# Patient Record
Sex: Male | Born: 1950 | Race: White | Marital: Married | Smoking: Never smoker
Health system: Southern US, Community
[De-identification: ages and names within clinical notes are randomized; demographics above are authoritative.]

## PROBLEM LIST (undated history)

## (undated) DIAGNOSIS — E119 Type 2 diabetes mellitus without complications: Secondary | ICD-10-CM

## (undated) DIAGNOSIS — R0602 Shortness of breath: Secondary | ICD-10-CM

## (undated) DIAGNOSIS — R058 Other specified cough: Secondary | ICD-10-CM

## (undated) DIAGNOSIS — R1110 Vomiting, unspecified: Secondary | ICD-10-CM

## (undated) DIAGNOSIS — R9431 Abnormal electrocardiogram [ECG] [EKG]: Secondary | ICD-10-CM

## (undated) DIAGNOSIS — D696 Thrombocytopenia, unspecified: Secondary | ICD-10-CM

## (undated) DIAGNOSIS — E785 Hyperlipidemia, unspecified: Secondary | ICD-10-CM

## (undated) DIAGNOSIS — A059 Bacterial foodborne intoxication, unspecified: Secondary | ICD-10-CM

## (undated) HISTORY — DX: Other specified cough: R05.8

## (undated) HISTORY — DX: Bacterial foodborne intoxication, unspecified: A05.9

## (undated) HISTORY — DX: Shortness of breath: R06.02

## (undated) HISTORY — DX: Type 2 diabetes mellitus without complications: E11.9

## (undated) HISTORY — DX: Hyperlipidemia, unspecified: E78.5

## (undated) HISTORY — PX: NO PAST SURGERIES: SHX2092

## (undated) HISTORY — DX: Thrombocytopenia, unspecified: D69.6

## (undated) HISTORY — DX: Vomiting, unspecified: R11.10

## (undated) HISTORY — DX: Abnormal electrocardiogram (ECG) (EKG): R94.31

---

## 2019-04-04 ENCOUNTER — Ambulatory Visit: Payer: Medicare Other | Attending: Internal Medicine

## 2019-04-04 DIAGNOSIS — Z23 Encounter for immunization: Secondary | ICD-10-CM

## 2019-04-04 NOTE — Progress Notes (Signed)
   Covid-19 Vaccination Clinic  Name:  Aarav Burgett    MRN: [id number] DOB: 05-25-1950  04/04/2019  Mr. Hanner was observed post Covid-19 immunization for 15 minutes without incidence. He was provided with Vaccine Information Sheet and instruction to access the V-Safe system.   Mr. Koenigs was instructed to call 911 with any severe reactions post vaccine: Marland Kitchen Difficulty breathing  . Swelling of your face and throat  . A fast heartbeat  . A bad rash all over your body  . Dizziness and weakness    Immunizations Administered    Name Date Dose VIS Date Route   Pfizer COVID-19 Vaccine 04/04/2019  9:14 AM 0.3 mL 01/17/2019 Intramuscular   Manufacturer: [employer], [employer]   Lot: [id number]   NDC: [address]-6

## 2019-04-29 ENCOUNTER — Ambulatory Visit: Payer: Medicare Other | Attending: Internal Medicine

## 2019-04-29 DIAGNOSIS — Z23 Encounter for immunization: Secondary | ICD-10-CM

## 2019-04-29 NOTE — Progress Notes (Signed)
   Covid-19 Vaccination Clinic  Name:  Danzel Marszalek    MRN: [id number] DOB: December 04, 1950  04/29/2019  Mr. Heitman was observed post Covid-19 immunization for 15 minutes without incident. He was provided with Vaccine Information Sheet and instruction to access the V-Safe system.   Mr. Bedel was instructed to call 911 with any severe reactions post vaccine: Marland Kitchen Difficulty breathing  . Swelling of face and throat  . A fast heartbeat  . A bad rash all over body  . Dizziness and weakness   Immunizations Administered    Name Date Dose VIS Date Route   Pfizer COVID-19 Vaccine 04/29/2019  9:57 AM 0.3 mL 01/17/2019 Intramuscular   Manufacturer: [employer], [employer]   Lot: [id number]   NDC: [address]-7

## 2019-10-09 ENCOUNTER — Telehealth: Payer: Self-pay

## 2019-10-09 NOTE — Telephone Encounter (Signed)
EKG ON FILE °

## 2019-10-09 NOTE — Telephone Encounter (Signed)
NOTES ON FILE 

## 2019-10-20 ENCOUNTER — Telehealth: Payer: Self-pay

## 2019-10-20 ENCOUNTER — Ambulatory Visit (INDEPENDENT_AMBULATORY_CARE_PROVIDER_SITE_OTHER): Payer: Medicare Other | Admitting: Internal Medicine

## 2019-10-20 ENCOUNTER — Other Ambulatory Visit: Payer: Self-pay

## 2019-10-20 VITALS — BP 100/70 | HR 74 | Ht 66.0 in | Wt 153.0 lb

## 2019-10-20 DIAGNOSIS — E119 Type 2 diabetes mellitus without complications: Secondary | ICD-10-CM | POA: Diagnosis not present

## 2019-10-20 DIAGNOSIS — E108 Type 1 diabetes mellitus with unspecified complications: Secondary | ICD-10-CM | POA: Insufficient documentation

## 2019-10-20 DIAGNOSIS — E1169 Type 2 diabetes mellitus with other specified complication: Secondary | ICD-10-CM | POA: Insufficient documentation

## 2019-10-20 DIAGNOSIS — E785 Hyperlipidemia, unspecified: Secondary | ICD-10-CM | POA: Diagnosis not present

## 2019-10-20 LAB — PRO B NATRIURETIC PEPTIDE: NT-Pro BNP: 2433 pg/mL — ABNORMAL HIGH (ref 0–376)

## 2019-10-20 NOTE — Telephone Encounter (Signed)
Spoke with the pts son and he reports the pt is not having any discomfort or peripheral edema but he will call prior to his Echo if he develops any symptoms of fluid overload. Otherwise will call after his echo results are available.

## 2019-10-20 NOTE — Progress Notes (Signed)
Cardiology Office Note:    Date:  10/20/2019   ID:  Bryan Melendez, DOB [birth year], MRN [id number]  PCP:  Soundra Pilon, FNP  [facility] Cardiologist:  No primary care provider on file.  [facility] Electrophysiologist:  None   Referring MD: Soundra Pilon, FNP   CC: Check up after trip. Seen for the evaluation of TWI on EKG In consultation at the behest of Bryan Melendez. Patient deferred use of [place] interpretor and opted for his son, who was amenable.  History of Present Illness:    Bryan Melendez is a 69 y.o. male with a hx of HLD on atorvastatin with LDL still 137, Thrombocytopenia NOS, T2DM without long term use of insulin (poorly controlled, A1c 9.7).  Presenting after Nausea and diaphoresis in [place].  Per outsides records [facility]) Patient had been seen in [place] in July 2021; there was concern about his heart and he was started on ASA, Plavix, Lasix, Bisoprolol.   Had chest pain with wretching at that admission.  Was concern he would need heart surgery.    Patient notes that he had food poisoning.  Given the vomiting and diaphoresis, concern for CAD.  Unclear what OSH facility in [place].  Patient notes cough weakness.  Patient end up not taking any of the above medications, only on atorvastatin and metformin.  Can do 9000 steps a day.  No chest pain, chills, night sweats.  Sweats only with exertion (walking up hill). Only one time in [place] did have nausea and vomiting. No PND/ orthopnea, or bendopnea.  Occasional dry cough.  No weight gain.  Past Medical History:  Diagnosis Date  . Abnormal EKG   . DM (diabetes mellitus) (HCC)   . Dry cough   . Food poisoning   . Hyperlipidemia   . SOB (shortness of breath)   . Thrombocytopenia (HCC)   . [place]omiting    [place]    Past Surgical History:  Procedure Laterality Date  . NO PAST SURGERIES      Current Medications: Current Meds  Medication Sig  . atorvastatin (LIPITOR) 40 MG tablet Take 40 mg by  mouth daily.  Marland Kitchen glipiZIDE-metformin (METAGLIP) 5-500 MG tablet Take 1 tablet by mouth 2 (two) times daily before a meal.     Allergies:   Patient has no known allergies.   Social History   Socioeconomic History  . Marital status: Married    Spouse name: Not on file  . Number of children: Not on file  . Years of education: Not on file  . Highest education level: Not on file  Occupational History  . Not on file  Tobacco Use  . Smoking status: Never Smoker  . Smokeless tobacco: Never Used  Substance and Sexual Activity  . Alcohol use: Not on file  . Drug use: Not on file  . Sexual activity: Not on file  Other Topics Concern  . Not on file  Social History Narrative  . Not on file   Social Determinants of Health   Financial Resource Strain:   . Difficulty of Paying Living Expenses: Not on file  Food Insecurity:   . Worried About Programme researcher, broadcasting/film/video in the Last Year: Not on file  . Ran Out of Food in the Last Year: Not on file  Transportation Needs:   . Lack of Transportation (Medical): Not on file  . Lack of Transportation (Non-Medical): Not on file  Physical Activity:   . Days of Exercise per Week:  Not on file  . Minutes of Exercise per Session: Not on file  Stress:   . Feeling of Stress : Not on file  Social Connections:   . Frequency of Communication with Friends and Family: Not on file  . Frequency of Social Gatherings with Friends and Family: Not on file  . Attends Religious Services: Not on file  . Active Member of Clubs or Organizations: Not on file  . Attends Banker Meetings: Not on file  . Marital Status: Not on file    From [place], retired, works on Manufacturing engineer.  Family History: The patient's family history includes Cancer - Prostate in his father; Healthy in his daughter, daughter, daughter, son, and son. No one in the family with heart problems.  ROS:   Please see the history of present illness.    Dysphagia, All other systems  reviewed and are negative.  EKGs/Labs/Other Studies Reviewed:    EKg from OSH personally reviewed:  Sinus rhythm ,rate 83, Left axis deviation, low volcation, anterolateral TWI  Outside labs:  10/08/19 A1c 9.7 LDL 137 Glucose 239 Creatine 0.97 Plt 123  Physical Exam:    VS:  BP 100/70   Pulse 74   Ht 5\' 6"  (1.676 m)   Wt 153 lb (69.4 kg)   SpO2 98%   BMI 24.69 kg/m     Wt Readings from Last 3 Encounters:  10/20/19 153 lb (69.4 kg)     GEN: Well nourished, well developed in no acute distress HEENT: R ear lob Frank Sign NECK: No JVD; No carotid bruits LYMPHATICS: No lymphadenopathy CARDIAC: RRR, no murmurs, rubs, gallops RESPIRATORY:  Clear to auscultation without rales, wheezing or rhonchi  ABDOMEN: Soft, non-tender, non-distended MUSCULOSKELETAL:  No edema; No deformity  SKIN: Warm and dry NEUROLOGIC:  Alert and oriented x 3 PSYCHIATRIC:  Normal affect   ASSESSMENT:    1. Hyperlipidemia, unspecified hyperlipidemia type   2. Type 2 diabetes mellitus without complication, without long-term current use of insulin (HCC)    PLAN:    In order of problems listed above:  1. Diabetes Mellitus 2. History of nausea, vomiting; presences of cough 3. SHARED DECISION MAKING: Patient is asymptomatic, but has DM.  Will check echo and BNP.  If no other sx, will decide further testing based on these results  6 month follow up  Medication Adjustments/Labs and Tests Ordered: Current medicines are reviewed at length with the patient today.  Concerns regarding medicines are outlined above.  No orders of the defined types were placed in this encounter.  No orders of the defined types were placed in this encounter.   There are no Patient Instructions on file for this visit.   Signed, 10/22/19, MD  10/20/2019 8:56 AM    [facility]

## 2019-10-20 NOTE — Telephone Encounter (Signed)
-----   Message from Christell Constant, MD sent at 10/20/2019  4:49 PM EDT ----- Results: Elevated, suggestive of increased ventricular stretch and elevated fluid Plan: Calling patient; low threshold to start lasix now.  Gave education on sx of volume overload; will otherwise make changes after 10/31/19 Echo.  Christell Constant, MD

## 2019-10-20 NOTE — Patient Instructions (Signed)
Medication Instructions: *If you need a refill on your cardiac medications before your next appointment, please call your pharmacy*   Lab Work: Pro BNP  If you have labs (blood work) drawn today and your tests are completely normal, you will receive your results only by: Marland Kitchen MyChart Message (if you have MyChart) OR . A paper copy in the mail If you have any lab test that is abnormal or we need to change your treatment, we will call you to review the results.   Testing/Procedures: Your physician has requested that you have an echocardiogram. Echocardiography is a painless test that uses sound waves to create images of your heart. It provides your doctor with information about the size and shape of your heart and how well your heart's chambers and valves are working. This procedure takes approximately one hour. There are no restrictions for this procedure.    Follow-Up: At [facility], you and your health needs are our priority.  As part of our continuing mission to provide you with exceptional heart care, we have created designated Provider Care Teams.  These Care Teams include your primary Cardiologist (physician) and Advanced Practice Providers (APPs -  Physician Assistants and Nurse Practitioners) who all work together to provide you with the care you need, when you need it.  We recommend signing up for the patient portal called "MyChart".  Sign up information is provided on this After Visit Summary.  MyChart is used to connect with patients for Virtual Visits (Telemedicine).  Patients are able to view lab/test results, encounter notes, upcoming appointments, etc.  Non-urgent messages can be sent to your provider as well.   To learn more about what you can do with MyChart, go to [email or web address].    Your next appointment:   6 month(s)  The format for your next appointment:   In Person  Provider:   Izora Ribas   Other Instructions

## 2019-10-31 ENCOUNTER — Telehealth: Payer: Self-pay | Admitting: Internal Medicine

## 2019-10-31 ENCOUNTER — Other Ambulatory Visit (HOSPITAL_COMMUNITY): Payer: Medicare Other

## 2019-10-31 ENCOUNTER — Encounter (HOSPITAL_COMMUNITY): Payer: Self-pay
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
## 2019-10-31 NOTE — Progress Notes (Signed)
Verified appointment "no show" status with S. Johnson at 08:29. 
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
## 2019-11-12 ENCOUNTER — Other Ambulatory Visit: Payer: Self-pay

## 2019-11-12 ENCOUNTER — Ambulatory Visit (HOSPITAL_COMMUNITY): Payer: Medicare Other | Attending: Internal Medicine

## 2019-11-12 DIAGNOSIS — R0600 Dyspnea, unspecified: Secondary | ICD-10-CM

## 2019-11-12 DIAGNOSIS — E119 Type 2 diabetes mellitus without complications: Secondary | ICD-10-CM | POA: Insufficient documentation

## 2019-11-12 DIAGNOSIS — E785 Hyperlipidemia, unspecified: Secondary | ICD-10-CM | POA: Insufficient documentation

## 2019-11-12 DIAGNOSIS — E108 Type 1 diabetes mellitus with unspecified complications: Secondary | ICD-10-CM | POA: Insufficient documentation

## 2019-11-12 LAB — ECHOCARDIOGRAM COMPLETE
Area-P 1/2: 4.8 cm2
Calc EF: 44.5 %
S' Lateral: 3.8 cm
Single Plane A2C EF: 41.9 %
Single Plane A4C EF: 45 %

## 2019-11-12 MED ORDER — PERFLUTREN LIPID MICROSPHERE
1.0000 mL | INTRAVENOUS | Status: AC | PRN
  Administered 2019-11-12: 1 mL via INTRAVENOUS

## 2019-11-13 ENCOUNTER — Telehealth: Payer: Self-pay

## 2019-11-13 ENCOUNTER — Telehealth: Payer: Self-pay | Admitting: Internal Medicine

## 2019-11-13 NOTE — Telephone Encounter (Signed)
Call placed to Pt with assistance of [place].  Call was answered by Pt's son.  Advised that Pt needed to be seen tomorrow 11/14/2019 to discuss results of echo.  Pt's son will come so no interpreter needed.

## 2019-11-13 NOTE — Telephone Encounter (Signed)
Attempted to call son X1 about his father.  Would like to bring back for eval and for patient and/or son to call back.  Needs schedule for follow up, GDMT converstation, and viability.

## 2019-11-13 NOTE — Telephone Encounter (Signed)
Patient's son returned call.  I did schedule patient for 10/20 with the doctor.  Son would like a call back.

## 2019-11-13 NOTE — Telephone Encounter (Signed)
Called patient.  Discussed Echo Findings.  Discussed limitations in therapy.  Discussed prognosis of heart failure.  Discussed GDMT.  Discussed viability study.  Discussed fluid restrictions.  Discussed etiology of heart failure.  Discussed changes of improvement in LVEF.  Noted that son has lots of questions and will do our best to discuss in more detail at follow up visit.  Patient had no further questions.  15 min encounter.

## 2019-11-13 NOTE — Telephone Encounter (Signed)
-----   Message from Christell Constant, MD sent at 11/12/2019  8:02 PM EDT ----- Results: LAD infarct with HFrEF Will bring back in next open spot (will either need [place] interpretor or son if patient defers)  Christell Constant, MD

## 2019-11-14 ENCOUNTER — Other Ambulatory Visit: Payer: Self-pay

## 2019-11-14 ENCOUNTER — Ambulatory Visit (INDEPENDENT_AMBULATORY_CARE_PROVIDER_SITE_OTHER): Payer: Medicare Other | Admitting: Internal Medicine

## 2019-11-14 ENCOUNTER — Encounter: Payer: Self-pay | Admitting: Internal Medicine

## 2019-11-14 ENCOUNTER — Telehealth: Payer: Self-pay | Admitting: Internal Medicine

## 2019-11-14 VITALS — BP 110/68 | HR 70 | Ht 66.0 in | Wt 157.0 lb

## 2019-11-14 DIAGNOSIS — E785 Hyperlipidemia, unspecified: Secondary | ICD-10-CM

## 2019-11-14 DIAGNOSIS — I5020 Unspecified systolic (congestive) heart failure: Secondary | ICD-10-CM | POA: Diagnosis not present

## 2019-11-14 DIAGNOSIS — E1169 Type 2 diabetes mellitus with other specified complication: Secondary | ICD-10-CM | POA: Diagnosis not present

## 2019-11-14 MED ORDER — ASPIRIN 81 MG PO TBEC: 81.0000 mg | DELAYED_RELEASE_TABLET | Freq: Every day | ORAL | 12 refills | Status: DC

## 2019-11-14 MED ORDER — LOSARTAN POTASSIUM 25 MG PO TABS: 25.0000 mg | ORAL_TABLET | Freq: Every day | ORAL | 3 refills | Status: DC

## 2019-11-14 MED ORDER — FUROSEMIDE 20 MG PO TABS: 20.0000 mg | ORAL_TABLET | Freq: Every day | ORAL | 3 refills | Status: DC

## 2019-11-14 NOTE — Telephone Encounter (Signed)
Spoke with patient's son Marlynn Perking) regarding patient's preferred weekdays and time for scheduling the Cardiac MRI that has been ordered.  Informed him as soon as we hear from the insurance company regarding the prior authorization---I will be in touch with the appointment information

## 2019-11-14 NOTE — Patient Instructions (Addendum)
Medication Instructions:  Your physician has recommended you make the following change in your medication:   1) Start Aspirin 81 mg, 1 tablet by mouth once a day 2) Start Losartan 25 mg, 1 tablet by mouth once a day 3) Start Lasix 20 mg, 1 tablet by mouth once a day  *If you need a refill on your cardiac medications before your next appointment, please call your pharmacy*  Lab Work: Your physician recommends that you return for lab work in 10 days on 11/24/19 **The lab is open from 7:30AM-4:30PM** you may come anytime between these hours  Testing/Procedures: Your physician has requested that you have a cardiac MRI. Cardiac MRI uses a computer to create images of your heart as its beating, producing both still and moving pictures of your heart and major blood vessels. For further information please visit [email or web address]. Please follow the instruction sheet given to you today for more information.  Follow-Up: At [facility], you and your health needs are our priority.  As part of our continuing mission to provide you with exceptional heart care, we have created designated Provider Care Teams.  These Care Teams include your primary Cardiologist (physician) and Advanced Practice Providers (APPs -  Physician Assistants and Nurse Practitioners) who all work together to provide you with the care you need, when you need it.  Your next appointment:   8 week(s)  The format for your next appointment:   In Person  Provider:   Riley Lam, MD

## 2019-11-14 NOTE — Progress Notes (Signed)
Cardiology Office Note:    Date:  11/14/2019   ID:  Bryan Melendez, DOB 1950/03/27, MRN [id number]  PCP:  Bryan Pilon, FNP  [facility] Cardiologist:  Christell Constant, MD  [facility] Electrophysiologist:  None   Referring MD: Bryan Pilon, FNP   CC: New HF  History of Present Illness:    Bryan Melendez is a 69 y.o. male with a hx of HLD, Thombocytopenia NOS, T2Dm without insulin use (last a1c).  In interval evaluation, Patient has elevated ProBNP 2433 and an echo showing apical infarct, no LV thrombus, and and new EF 20-25%.  Patient notes dry cough.  Notes 4 lbs weight gain (back to before his trip).  No SOB, DOE, chest pain, syncope.  No Near syncope.  Still helps out at work.  No abdominal pain, diaphoresis. No palpitations.  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    Past Medical History:  Diagnosis Date  . Abnormal EKG   . DM (diabetes mellitus) (HCC)   . Dry cough   . Food poisoning   . Hyperlipidemia   . SOB (shortness of breath)   . Thrombocytopenia (HCC)   . [place]omiting    [place]    Past Surgical History:  Procedure Laterality Date  . NO PAST SURGERIES      Current Medications: Current Meds  Medication Sig  . atorvastatin (LIPITOR) 40 MG tablet Take 40 mg by mouth daily.  Marland Kitchen glipiZIDE-metformin (METAGLIP) 5-500 MG tablet Take 1 tablet by mouth 2 (two) times daily before a meal.    Allergies:   Patient has no known allergies.   Social History   Socioeconomic History  . Marital status: Married    Spouse name: Not on file  . Number of children: Not on file  . Years of education: Not on file  . Highest education level: Not on file  Occupational History  . Not on file  Tobacco Use  . Smoking status: Never Smoker  . Smokeless tobacco: Never Used  Substance and Sexual Activity  . Alcohol use: Not on file  . Drug use: Not on file  . Sexual activity: Not on file  Other Topics Concern  . Not on file  Social History  Narrative  . Not on file   Social Determinants of Health   Financial Resource Strain:   . Difficulty of Paying Living Expenses: Not on file  Food Insecurity:   . Worried About Programme researcher, broadcasting/film/video in the Last Year: Not on file  . Ran Out of Food in the Last Year: Not on file  Transportation Needs:   . Lack of Transportation (Medical): Not on file  . Lack of Transportation (Non-Medical): Not on file  Physical Activity:   . Days of Exercise per Week: Not on file  . Minutes of Exercise per Session: Not on file  Stress:   . Feeling of Stress : Not on file  Social Connections:   . Frequency of Communication with Friends and Family: Not on file  . Frequency of Social Gatherings with Friends and Family: Not on file  . Attends Religious Services: Not on file  . Active Member of Clubs or Organizations: Not on file  . Attends Banker Meetings: Not on file  . Marital Status: Not on file     Family History: The patient's family history includes Cancer - Prostate in his father; Healthy in his daughter, daughter, daughter, son, and son.  ROS:  Please see the history of present illness.    All other systems reviewed and are negative.  EKGs/Labs/Other Studies Reviewed:    The following studies were reviewed today: 10/08/19 OSH Data from prior visit: EKG:  Sinus rhythm ,rate 83, Left axis deviation, low volcation, anterolateral TWI Outside labs:  10/08/19 A1c 9.7 LDL 137 Glucose 239 Creatine 0.97 Plt 123  EKG today Sinus rhythm, rate 70, anterior Q waves with deep anterior and anterolateral TWI.  EKG:  Recent Labs: 10/20/2019: NT-Pro BNP 2,433   IMPRESSIONS  EF 20-25%- concerned for LAD Infarct. 1. There is no apical LV thrombus on contrast imaging. LVEF is severely  reduced 20-25%. The mid septum, all apical segments, and apex are  akinetic. The myocardium is thinned. Overall, this is conerning for a  large wrap around LAD infarction. Left  ventricular ejection  fraction, by estimation, is 20 to 25%. The left  ventricle has severely decreased function. The left ventricle demonstrates  regional wall motion abnormalities (see scoring diagram/findings for  description). Left ventricular diastolic  parameters are consistent with Grade III diastolic dysfunction  (restrictive). Elevated left atrial pressure.  2. Right ventricular systolic function is normal. The right ventricular  size is normal. There is mildly elevated pulmonary artery systolic  pressure. The estimated right ventricular systolic pressure is 43.3 mmHg.  3. Left atrial size was moderately dilated.  4. The mitral valve is degenerative. Trivial mitral valve regurgitation.  No evidence of mitral stenosis.  5. The aortic valve is tricuspid. Aortic valve regurgitation is trivial.  No aortic stenosis is present.  6. The inferior vena cava is dilated in size with >50% respiratory  variability, suggesting right atrial pressure of 8 mmHg.   Conclusion(s)/Recommendation(s): Findings consistent with ischemic  cardiomyopathy.   Physical Exam:    VS:  BP 110/68   Pulse 70   Ht 5\' 6"  (1.676 m)   Wt 157 lb (71.2 kg)   SpO2 97%   BMI 25.34 kg/m     Wt Readings from Last 3 Encounters:  11/14/19 157 lb (71.2 kg)  10/20/19 153 lb (69.4 kg)    GEN: Well nourished, well developed in no acute distress HEENT: Normal NECK: No JVD; No carotid bruits LYMPHATICS: No lymphadenopathy CARDIAC: RRR, no murmurs, rubs, gallops RESPIRATORY:  Clear to auscultation without rales, wheezing or rhonchi  ABDOMEN: Soft, non-tender, non-distended MUSCULOSKELETAL:  No edema; No deformity  SKIN: Warm and dry NEUROLOGIC:  Alert and oriented x 3 PSYCHIATRIC:  Normal affect   ASSESSMENT:    1. HFrEF (heart failure with reduced ejection fraction) (HCC)   2. Hyperlipidemia, unspecified hyperlipidemia type   3. Type 2 diabetes mellitus with hyperlipidemia (HCC)    PLAN:    In order of problems listed  above:  New Systolic Heart Failure Concern for CAD HLD, DM  - NYHA class I, Stage B, hypervolemic, etiology from coronaries needs to be excluded - Will start lasix 20 mg po Daily - ASA 81 mg start - Discussed daily weights, and fluid restriction of < 2 L  - Discussed physical restrictions. - Check BMP, Mg 7-10 days - Will defer metoprolol succinate 25 mg PO daily till next visit - will start losartan 25 mg and will eventually push to entresto - will attempt MRA in subsequent visits - Will obtain ambulatory BP cuff monitoring - will get CMR Viability study - Will discussed with PCP 10/22/19 about outpatient SGLT2i  - [name] Interpretor number [phone] - will offer future cardiac rehab  6-8 weeks follow up for medication titraton  Medication Adjustments/Labs and Tests Ordered: Current medicines are reviewed at length with the patient today.  Concerns regarding medicines are outlined above.  No orders of the defined types were placed in this encounter.  No orders of the defined types were placed in this encounter.   There are no Patient Instructions on file for this visit.   Signed, Christell Constant, MD  11/14/2019 11:03 AM    [facility]

## 2019-11-18 ENCOUNTER — Encounter: Payer: Self-pay | Admitting: Internal Medicine

## 2019-11-18 NOTE — Telephone Encounter (Signed)
Spoke with son Sajmir regarding appointment for Cardiac MRI scheduled Wednesday 12/10/19 at 9:00 am at [facility]-----arrival time is 8:30 am 1st floor admissions office----will mail informationt to patient ---Sajmir voiced his understanding.

## 2019-11-24 ENCOUNTER — Other Ambulatory Visit: Payer: Self-pay

## 2019-11-24 ENCOUNTER — Other Ambulatory Visit: Payer: Medicare Other | Admitting: *Deleted

## 2019-11-24 DIAGNOSIS — E785 Hyperlipidemia, unspecified: Secondary | ICD-10-CM

## 2019-11-24 DIAGNOSIS — E1169 Type 2 diabetes mellitus with other specified complication: Secondary | ICD-10-CM

## 2019-11-24 DIAGNOSIS — I5020 Unspecified systolic (congestive) heart failure: Secondary | ICD-10-CM

## 2019-11-25 LAB — PRO B NATRIURETIC PEPTIDE: NT-Pro BNP: 837 pg/mL — ABNORMAL HIGH (ref 0–376)

## 2019-11-25 LAB — MAGNESIUM: Magnesium: 1.9 mg/dL (ref 1.6–2.3)

## 2019-11-26 ENCOUNTER — Telehealth: Payer: Self-pay | Admitting: Internal Medicine

## 2019-11-26 ENCOUNTER — Ambulatory Visit: Payer: Medicare Other | Admitting: Internal Medicine

## 2019-11-26 NOTE — Telephone Encounter (Signed)
Pt's son aware of lab results  B/P readings :  10-11  B/P 113/77 HR 66                     112/76      70                               111/66      77                      112/69      67                      110/70     67                     102/64      72                     115/72     67 Per pt feels fine Will forward to Dr Izora Ribas for review and recommendations./cy

## 2019-11-26 NOTE — Telephone Encounter (Signed)
Follow Up:     Pt is returning call from today, concerning his lab results.

## 2019-11-26 NOTE — Telephone Encounter (Signed)
Attempted call X1:  Labs have improved.  Left Voicemail calling with good news.  Would attempted to get ambulatory blood pressure and heart rate.  Goal to add metoprolol succinate 25 mg q one daily based on ambulatory data.  Team: Please reach out to patient later in day with good news about lab and asking for this data.  Thank you!  Christell Constant, MD

## 2019-11-26 NOTE — Telephone Encounter (Signed)
Attempted call X1:  Labs have improved.  Left Voicemail calling with good news.  Would attempted to get ambulatory blood pressure and heart rate.  Goal to add metoprolol succinate 25 mg q one daily based on ambulatory data.  Team: Please reach out to patient later in day with good news about lab and asking for this data.  Thank you!  Quayshawn Nin A Elai Vanwyk, MD  

## 2019-11-27 ENCOUNTER — Other Ambulatory Visit: Payer: Self-pay | Admitting: *Deleted

## 2019-11-27 MED ORDER — METOPROLOL SUCCINATE 25 MG PO CS24: 25.0000 mg | EXTENDED_RELEASE_CAPSULE | Freq: Every day | ORAL | 6 refills | Status: DC

## 2019-11-27 MED ORDER — METOPROLOL SUCCINATE 25 MG PO CS24: 25.0000 mg | EXTENDED_RELEASE_CAPSULE | Freq: Every day | ORAL | 11 refills | Status: DC

## 2019-11-27 MED ORDER — METOPROLOL SUCCINATE ER 25 MG PO TB24: 25.0000 mg | ORAL_TABLET | Freq: Every day | ORAL | 11 refills | Status: DC

## 2019-11-27 NOTE — Addendum Note (Signed)
Addended by: Scherrie Bateman E on: 11/27/2019 12:31 PM   Modules accepted: Orders

## 2019-11-27 NOTE — Telephone Encounter (Signed)
Pt's son aware of recommendations agrees and verbalizes understanding ./cy

## 2019-11-27 NOTE — Telephone Encounter (Signed)
Let's try metoprolol succinate 25 mg daily.  We would ask that the patient check his blood pressure and heart rate.  If he starts feeling more tired and fatigued on this medication, we will stop it.  This is one of those live longer, or mortality medications, so it is worth an attempt to see if it could help strengthen his heart.  Thanks,  Christell Constant, MD

## 2019-12-09 ENCOUNTER — Telehealth (HOSPITAL_COMMUNITY): Payer: Self-pay | Admitting: Emergency Medicine

## 2019-12-09 NOTE — Telephone Encounter (Signed)
Reaching out to patient to offer assistance regarding upcoming cardiac imaging study; pt verbalizes understanding of appt date/time, parking situation and where to check in, pre-test NPO status and medications ordered, and verified current allergies; name and call back number provided for further questions should they arise Rockwell Alexandria RN Navigator Cardiac Imaging [facility] [phone] office [phone] cell  Pt speaks [place] Spoke to patients son who speaks english and available to help translate for the patient the day of the test.  Denies implants, denies claustro.

## 2019-12-10 ENCOUNTER — Telehealth: Payer: Self-pay | Admitting: *Deleted

## 2019-12-10 ENCOUNTER — Other Ambulatory Visit: Payer: Self-pay

## 2019-12-10 ENCOUNTER — Ambulatory Visit (HOSPITAL_COMMUNITY)
Admission: RE | Admit: 2019-12-10 | Discharge: 2019-12-10 | Disposition: A | Discharge status: Home / Self Care | Payer: Medicare Other | Source: Ambulatory Visit | Attending: Internal Medicine | Admitting: Internal Medicine

## 2019-12-10 DIAGNOSIS — I5020 Unspecified systolic (congestive) heart failure: Secondary | ICD-10-CM

## 2019-12-10 MED ORDER — GADOBUTROL 1 MMOL/ML IV SOLN
8.0000 mL | Freq: Once | INTRAVENOUS | Status: AC | PRN
  Administered 2019-12-10: 8 mL via INTRAVENOUS

## 2019-12-10 NOTE — Telephone Encounter (Signed)
I spoke with patient's son and reviewed cardiac MRI results with him.  Appointment with Dr Izora Ribas moved to earlier date--November 78,4784 at 8:20. Son reports the following readings for patient since starting Toprol. 10/21-125/74,69 10/22-111/69,66 10/23-100/64,62 10/24-119/59,64 10/25-109/70,65 10/26-108/62,59 10/27-110/70,62 10/28-125/70,66 10/29-108/68,66  I told him readings looked OK and we would call him back if Dr Izora Ribas wanted to make any changes.

## 2019-12-25 ENCOUNTER — Ambulatory Visit (INDEPENDENT_AMBULATORY_CARE_PROVIDER_SITE_OTHER): Payer: Medicare Other | Admitting: Internal Medicine

## 2019-12-25 ENCOUNTER — Encounter: Payer: Self-pay | Admitting: Internal Medicine

## 2019-12-25 ENCOUNTER — Other Ambulatory Visit: Payer: Self-pay

## 2019-12-25 VITALS — BP 108/62 | HR 59 | Ht 66.0 in | Wt 159.0 lb

## 2019-12-25 DIAGNOSIS — E785 Hyperlipidemia, unspecified: Secondary | ICD-10-CM

## 2019-12-25 DIAGNOSIS — I5020 Unspecified systolic (congestive) heart failure: Secondary | ICD-10-CM

## 2019-12-25 DIAGNOSIS — R931 Abnormal findings on diagnostic imaging of heart and coronary circulation: Secondary | ICD-10-CM | POA: Diagnosis not present

## 2019-12-25 DIAGNOSIS — E1169 Type 2 diabetes mellitus with other specified complication: Secondary | ICD-10-CM | POA: Diagnosis not present

## 2019-12-25 MED ORDER — ENTRESTO 24-26 MG PO TABS: 1.0000 | ORAL_TABLET | Freq: Two times a day (BID) | ORAL | 12 refills | Status: DC

## 2019-12-25 NOTE — Patient Instructions (Addendum)
Medication Instructions:  Your physician has recommended you make the following change in your medication:   1) Stop Losartan 2) Start Entresto 24-26 mg, 1 tablet by mouth twice a day  *If you need a refill on your cardiac medications before your next appointment, please call your pharmacy*   Lab Work: Your physician recommends that you return for lab work in 7 days for BMET/Magnesium/BNP  Testing/Procedures: Your physician has requested that you have an echocardiogram in 90 days. Echocardiography is a painless test that uses sound waves to create images of your heart. It provides your doctor with information about the size and shape of your heart and how well your heart's chambers and valves are working. This procedure takes approximately one hour. There are no restrictions for this procedure.  Your cardiac CT will be scheduled at one of the below locations:   [facility] [address] [place], [place] [address] [phone]  [place] [address] [place], [place] [address] [phone]  If scheduled at [facility], please arrive at the [facility] main entrance of [facility] 30 minutes prior to test start time. Proceed to the [facility] Radiology Department (first floor) to check-in and test prep.  If scheduled at [facility], please arrive 15 mins early for check-in and test prep.  Please follow these instructions carefully (unless otherwise directed):  On the Night Before the Test: . Be sure to Drink plenty of water. . Do not consume any caffeinated/decaffeinated beverages or chocolate 12 hours prior to your test. . Do not take any antihistamines 12 hours prior to your test.  On the Day of the Test: . Drink plenty of water. Do not drink any water within one hour of the test. . Do not eat any food 4 hours prior to the test. . You may take your regular  medications prior to the test.  . Take metoprolol (Lopressor) two hours prior to test. . HOLD Furosemide morning of the test. . HOLD Glipizide-Metformin the morning of your test and two days after your test.    After the Test: . Drink plenty of water. . After receiving IV contrast, you may experience a mild flushed feeling. This is normal. . On occasion, you may experience a mild rash up to 24 hours after the test. This is not dangerous. If this occurs, you can take Benadryl 25 mg and increase your fluid intake. . If you experience trouble breathing, this can be serious. If it is severe call 911 IMMEDIATELY. If it is mild, please call our office. . If you take any of these medications: Glipizide/Metformin, Avandament, Glucavance, please do not take 48 hours after completing test unless otherwise instructed.  Once we have confirmed authorization from your insurance company, we will call you to set up a date and time for your test. Based on how quickly your insurance processes prior authorizations requests, please allow up to 4 weeks to be contacted for scheduling your Cardiac CT appointment. Be advised that routine Cardiac CT appointments could be scheduled as many as 8 weeks after your provider has ordered it.  For non-scheduling related questions, please contact the cardiac imaging nurse navigator should you have any questions/concerns: Marchia Bond, Cardiac Imaging Nurse Navigator Burley Saver, Interim Cardiac Imaging Nurse [place] and Vascular Services Direct Office Dial: [phone]   For scheduling needs, including cancellations and rescheduling, please call [place], [phone] (temporary number).  Follow-Up: At [facility], you and your health needs are our priority.  As part of our continuing mission to provide you with exceptional heart care, we have created designated Provider Care Teams.  These Care Teams include your primary Cardiologist (physician) and  Advanced Practice Providers (APPs -  Physician Assistants and Nurse Practitioners) who all work together to provide you with the care you need, when you need it.  Your next appointment:   3 months  The format for your next appointment:   In Person  Provider:   Rudean Haskell, MD

## 2019-12-25 NOTE — Progress Notes (Signed)
Cardiology Office Note:    Date:  12/25/2019   ID:  Bryan Melendez, DOB November 08, 1950, MRN [id number]  PCP:  Kristen Loader, FNP  [facility] Cardiologist:  Werner Lean, MD  [place] Electrophysiologist:  None   Referring MD: Kristen Loader, FNP  CC:  Follow up HF and after MRI viability  Patient deferred interpretor in lieu of his son - [place] Interpretor number [phone]  History of Present Illness:    Bryan Melendez is a 69 y.o. male with a hx of HLD, Thombocytopenia NOS, T2DM who had a likely NSTEMI 06/2019 in [place], seen in consultation 10/20/19.  Found to have HFrEF (20-25%) 11/12/19 with LAD WMAs and NT-proBNP of 2433.  On GDMT (lasix 20 mg, losartan 25 mg, metoprolol succinate 25 mg), NT-proBNP improved to 837, MR was ~35% with evidence of apical transmural scar.  Patient notes that he is doing well, and that his strength has gone up.  Stays busy; takes his grand-niece to school and has been working on the house without issues.  No chest pain, no breathing problems.  Weight is up this is his normal weight, but patient denies LE swelling, PND.  No syncope or near syncope.  Past Medical History:  Diagnosis Date  . Abnormal EKG   . DM (diabetes mellitus) ([place])   . Dry cough   . Food poisoning   . Hyperlipidemia   . SOB (shortness of breath)   . Thrombocytopenia ([place])   . Vomiting    [place]    Past Surgical History:  Procedure Laterality Date  . NO PAST SURGERIES      Current Medications: Current Meds  Medication Sig  . aspirin 81 MG EC tablet Take 1 tablet (81 mg total) by mouth daily. Swallow whole.  Marland Kitchen atorvastatin (LIPITOR) 40 MG tablet Take 40 mg by mouth daily.  . furosemide (LASIX) 20 MG tablet Take 1 tablet (20 mg total) by mouth daily.  Marland Kitchen glipiZIDE-metformin (METAGLIP) 5-500 MG tablet Take 1 tablet by mouth 2 (two) times daily before a meal.  . metoprolol succinate (TOPROL XL) 25 MG 24 hr tablet Take 1 tablet (25 mg total) by mouth  daily.  . [DISCONTINUED] losartan (COZAAR) 25 MG tablet Take 1 tablet (25 mg total) by mouth daily.    Allergies:   Patient has no known allergies.   Social History   Socioeconomic History  . Marital status: Married    Spouse name: Not on file  . Number of children: Not on file  . Years of education: Not on file  . Highest education level: Not on file  Occupational History  . Not on file  Tobacco Use  . Smoking status: Never Smoker  . Smokeless tobacco: Never Used  Substance and Sexual Activity  . Alcohol use: Not on file  . Drug use: Not on file  . Sexual activity: Not on file  Other Topics Concern  . Not on file  Social History Narrative  . Not on file   Social Determinants of Health   Financial Resource Strain:   . Difficulty of Paying Living Expenses: Not on file  Food Insecurity:   . Worried About Charity fundraiser in the Last Year: Not on file  . Ran Out of Food in the Last Year: Not on file  Transportation Needs:   . Lack of Transportation (Medical): Not on file  . Lack of Transportation (Non-Medical): Not on file  Physical Activity:   . Days of Exercise  per Week: Not on file  . Minutes of Exercise per Session: Not on file  Stress:   . Feeling of Stress : Not on file  Social Connections:   . Frequency of Communication with Friends and Family: Not on file  . Frequency of Social Gatherings with Friends and Family: Not on file  . Attends Religious Services: Not on file  . Active Member of Clubs or Organizations: Not on file  . Attends Archivist Meetings: Not on file  . Marital Status: Not on file   Family History: The patient's family history includes Cancer - Prostate in his father; Healthy in his daughter, daughter, daughter, son, and son.  ROS:   Please see the history of present illness.    All other systems reviewed and are negative.  EKGs/Labs/Other Studies Reviewed:    The following studies were reviewed today: 10/08/19 OSH Data from  prior visit: EKG:  Sinus rhythm ,rate 83, Left axis deviation, low volcation, anterolateral TWI Outside labs:  10/08/19 A1c 9.7 LDL 137 Glucose 239 Creatine 0.97 Plt 123  EKG today Sinus rhythm, rate 70, anterior Q waves with deep anterior and anterolateral TWI.  EKG:  Recent Labs: 11/24/2019: Magnesium 1.9; NT-Pro BNP 837   IMPRESSIONS  EF 20-25%- concerned for LAD Infarct. 1. There is no apical LV thrombus on contrast imaging. LVEF is severely  reduced 20-25%. The mid septum, all apical segments, and apex are  akinetic. The myocardium is thinned. Overall, this is conerning for a  large wrap around LAD infarction. Left  ventricular ejection fraction, by estimation, is 20 to 25%. The left  ventricle has severely decreased function. The left ventricle demonstrates  regional wall motion abnormalities (see scoring diagram/findings for  description). Left ventricular diastolic  parameters are consistent with Grade III diastolic dysfunction  (restrictive). Elevated left atrial pressure.  2. Right ventricular systolic function is normal. The right ventricular  size is normal. There is mildly elevated pulmonary artery systolic  pressure. The estimated right ventricular systolic pressure is 34.2 mmHg.  3. Left atrial size was moderately dilated.  4. The mitral valve is degenerative. Trivial mitral valve regurgitation.  No evidence of mitral stenosis.  5. The aortic valve is tricuspid. Aortic valve regurgitation is trivial.  No aortic stenosis is present.  6. The inferior vena cava is dilated in size with >50% respiratory  variability, suggesting right atrial pressure of 8 mmHg.   Conclusion(s)/Recommendation(s): Findings consistent with ischemic  cardiomyopathy.    12/10/19 CMR FINDINGS: 1. Moderate to severely dilated left ventricle, thin myocardium, and systolic function is moderately reduced (LVEF =36%). There is mid anterior, anteroseptal, and inferoseptal hypokinesis, as  well as apical hypokinesis (all territories). No LV thrombus is noted. There is transmural late gadolinium enhancement: Inferoseptal mid and apex, anteroseptal mid and apex, anterior mid and apex, anterolateral apex, and true apex. These segments are likely nonviable.  LVEDD: 63 mm  LVESD: 41 mm  LVEDVi:132 mL/m2  SVi:48 mL/m2  CI: 3.08 L/min/m2  Myocardial mass/BSA:77 g/m2  2. Normal right ventricular size, thickness and systolic function (RVEF =87%). There are no regional wall motion abnormalities.  3.  Normal left and right atrial size.  LVESV 70 mL  RVESV 26 mL  4. Normal size of the aortic root, ascending aorta and pulmonary artery.  5.  No significant valvular abnormalities.  6.  Normal pericardium.  No pericardial effusion.  IMPRESSION: Moderate to severely dilated left ventricle, thin myocardium, and systolic function is moderately reduced (LVEF =36%).  There is mid anterior, anteroseptal, and inferoseptal hypokinesis, as well as apical hypokinesis (all territories). No LV thrombus is noted. There is transmural late gadolinium enhancement: Inferoseptal mid and apex, anteroseptal mid and apex, anterior mid and apex, anterolateral apex, and true apex. These segments are likely nonviable.  Tempie Gibeault  Physical Exam:    VS:  BP 108/62   Pulse (!) 59   Ht 5' 6"  (1.676 m)   Wt 159 lb (72.1 kg)   SpO2 97%   BMI 25.66 kg/m     Wt Readings from Last 3 Encounters:  12/25/19 159 lb (72.1 kg)  11/14/19 157 lb (71.2 kg)  10/20/19 153 lb (69.4 kg)    GEN: Well nourished, well developed in no acute distress HEENT: Normal NECK: No JVD; No carotid bruits LYMPHATICS: No lymphadenopathy CARDIAC: RRR, no murmurs, rubs, gallops RESPIRATORY:  Clear to auscultation without rales, wheezing or rhonchi  ABDOMEN: Soft, non-tender, non-distended MUSCULOSKELETAL:  No edema; No deformity  SKIN: Warm and dry NEUROLOGIC:  Alert and oriented x  3 PSYCHIATRIC:  Normal affect   ASSESSMENT:    1. HFrEF (heart failure with reduced ejection fraction) ([place])   2. Type 2 diabetes mellitus with hyperlipidemia ([place])   3. Hyperlipidemia, unspecified hyperlipidemia type   4. Abnormal findings on diagnostic imaging of heart and coronary circulation     PLAN:    In order of problems listed above:  Heart Failure with reduced ejection Fraction; EF 35% Concern for CAD HLD, DM  - NYHA class I, Stage B, euvolemic, etiology from coronaries needs to be excluded - Will continue lasix 20 mg po Daily - ASA 81 mg  - metoprolol succinate 25 mg PO Daily - will transition losartan 25 mg to ARNI 24-26 mg (gave patient assistance information; if this becomes an issues will return to losartan 50 mg) - Discussed daily weights, and fluid restriction of < 2 L  - Check BMP, Mg, NT- proBNP 7-10 days - will get repeat echo in 90 days and will discuss ICD further based on these results - will attempt MRA in subsequent visits (likely after BMP/Mg post entresto switch barring kidney dysfunction) - will attempt future SGLTi (concern that cost may be prohibitive) - Continue Ambulatory BP cuff monitoring - Would offer cardiac rehab in the future  SHARED DECISION MAKING:  Discussed at length the risk and benefits of LHC to define his anatomy in the setting of non viable territory in the LAD, DM, and Asymptomatic HFrEF.  Discussed Risks and benefits of cardiac catheterization have been discussed with the patient.  These include bleeding, infection, kidney damage, stroke, heart attack, death.  Discussed possible PCI, possible CABG, and how this may or may not increase his EF and improve sx.  Discussed alternatives (GDMT only, CCTA +/- FFR, or LHC).  Discussed at length with patient and son.  Will try CCTA  Briefly broached the conversation of primary prevention ICD  6-8 weeks(after CCTA) follow up unless new symptoms or abnormal test results warranting change in  plan  Medication Adjustments/Labs and Tests Ordered: Current medicines are reviewed at length with the patient today.  Concerns regarding medicines are outlined above.  Orders Placed This Encounter  Procedures  . CT CORONARY MORPH W/CTA COR W/SCORE W/CA W/CM &/OR WO/CM  . CT CORONARY FRACTIONAL FLOW RESERVE DATA PREP  . CT CORONARY FRACTIONAL FLOW RESERVE FLUID ANALYSIS  . Basic metabolic panel  . Magnesium  . Pro b natriuretic peptide (BNP)  . ECHOCARDIOGRAM COMPLETE  Meds ordered this encounter  Medications  . sacubitril-valsartan (ENTRESTO) 24-26 MG    Sig: Take 1 tablet by mouth 2 (two) times daily.    Dispense:  60 tablet    Refill:  12    Patient Instructions  Medication Instructions:  Your physician has recommended you make the following change in your medication:   1) Stop Losartan 2) Start Entresto 24-26 mg, 1 tablet by mouth twice a day  *If you need a refill on your cardiac medications before your next appointment, please call your pharmacy*   Lab Work: Your physician recommends that you return for lab work in 7 days for BMET/Magnesium/BNP  Testing/Procedures: Your physician has requested that you have an echocardiogram in 90 days. Echocardiography is a painless test that uses sound waves to create images of your heart. It provides your doctor with information about the size and shape of your heart and how well your heart's chambers and valves are working. This procedure takes approximately one hour. There are no restrictions for this procedure.  Your cardiac CT will be scheduled at one of the below locations:   [facility] [address] [place], [place] [address] [phone]  [place] [address] [place], [place] [address] [phone]  If scheduled at [facility], please arrive at the [facility] main entrance of [facility] 30 minutes prior to test start  time. Proceed to the [facility] Radiology Department (first floor) to check-in and test prep.  If scheduled at [facility], please arrive 15 mins early for check-in and test prep.  Please follow these instructions carefully (unless otherwise directed):  On the Night Before the Test: . Be sure to Drink plenty of water. . Do not consume any caffeinated/decaffeinated beverages or chocolate 12 hours prior to your test. . Do not take any antihistamines 12 hours prior to your test.  On the Day of the Test: . Drink plenty of water. Do not drink any water within one hour of the test. . Do not eat any food 4 hours prior to the test. . You may take your regular medications prior to the test.  . Take metoprolol (Lopressor) two hours prior to test. . HOLD Furosemide morning of the test. . HOLD Glipizide-Metformin the morning of your test and two days after your test.    After the Test: . Drink plenty of water. . After receiving IV contrast, you may experience a mild flushed feeling. This is normal. . On occasion, you may experience a mild rash up to 24 hours after the test. This is not dangerous. If this occurs, you can take Benadryl 25 mg and increase your fluid intake. . If you experience trouble breathing, this can be serious. If it is severe call 911 IMMEDIATELY. If it is mild, please call our office. . If you take any of these medications: Glipizide/Metformin, Avandament, Glucavance, please do not take 48 hours after completing test unless otherwise instructed.  Once we have confirmed authorization from your insurance company, we will call you to set up a date and time for your test. Based on how quickly your insurance processes prior authorizations requests, please allow up to 4 weeks to be contacted for scheduling your Cardiac CT appointment. Be advised that routine Cardiac CT appointments could be scheduled as many as 8 weeks after your provider has ordered it.  For  non-scheduling related questions, please contact the cardiac imaging nurse  navigator should you have any questions/concerns: Marchia Bond, Cardiac Imaging Nurse Navigator Burley Saver, Interim Cardiac Imaging Nurse Navigator [place] [facility] Direct Office Dial: [phone]   For scheduling needs, including cancellations and rescheduling, please call [place], [phone] (temporary number).   Follow-Up: At [facility], you and your health needs are our priority.  As part of our continuing mission to provide you with exceptional heart care, we have created designated Provider Care Teams.  These Care Teams include your primary Cardiologist (physician) and Advanced Practice Providers (APPs -  Physician Assistants and Nurse Practitioners) who all work together to provide you with the care you need, when you need it.  Your next appointment:   3 months  The format for your next appointment:   In Person  Provider:   Rudean Haskell, MD     Signed, Werner Lean, MD  12/25/2019 9:54 AM    [place]

## 2020-01-05 ENCOUNTER — Other Ambulatory Visit: Payer: Medicare Other | Admitting: *Deleted

## 2020-01-05 ENCOUNTER — Other Ambulatory Visit: Payer: Self-pay

## 2020-01-05 DIAGNOSIS — I5020 Unspecified systolic (congestive) heart failure: Secondary | ICD-10-CM

## 2020-01-05 LAB — BASIC METABOLIC PANEL
BUN/Creatinine Ratio: 16 (ref 10–24)
BUN: 16 mg/dL (ref 8–27)
CO2: 23 mmol/L (ref 20–29)
Calcium: 9.5 mg/dL (ref 8.6–10.2)
Chloride: 103 mmol/L (ref 96–106)
Creatinine, Ser: 1 mg/dL (ref 0.76–1.27)
GFR calc Af Amer: 90 mL/min/{1.73_m2} (ref >59)
GFR calc non Af Amer: 78 mL/min/{1.73_m2} (ref >59)
Glucose: 250 mg/dL — ABNORMAL HIGH (ref 65–99)
Potassium: 5.1 mmol/L (ref 3.5–5.2)
Sodium: 138 mmol/L (ref 134–144)

## 2020-01-05 LAB — MAGNESIUM: Magnesium: 1.7 mg/dL (ref 1.6–2.3)

## 2020-01-05 LAB — PRO B NATRIURETIC PEPTIDE: NT-Pro BNP: 597 pg/mL — ABNORMAL HIGH (ref 0–376)

## 2020-01-09 ENCOUNTER — Ambulatory Visit: Payer: Medicare Other | Admitting: Internal Medicine

## 2020-01-15 ENCOUNTER — Other Ambulatory Visit (HOSPITAL_COMMUNITY): Payer: Medicare Other

## 2020-01-16 ENCOUNTER — Other Ambulatory Visit (HOSPITAL_COMMUNITY): Payer: Medicare Other

## 2020-02-10 ENCOUNTER — Telehealth: Payer: Self-pay

## 2020-02-10 DIAGNOSIS — Z01812 Encounter for preprocedural laboratory examination: Secondary | ICD-10-CM

## 2020-02-10 NOTE — Telephone Encounter (Signed)
-----   Message from Lorrin Jackson sent at 02/10/2020 11:48 AM EST ----- Regarding: ct heart Scheduled 02/18/20 at 8:30  Pt will need labs done for this ct scan.   Thanks, [place]

## 2020-02-10 NOTE — Telephone Encounter (Signed)
I called and spoke with patients son Sajmir to set up BMET prior to CT scan. Sajmir will bring patient by on 02/12/20 for lab work.

## 2020-02-12 ENCOUNTER — Other Ambulatory Visit: Payer: Self-pay

## 2020-02-12 ENCOUNTER — Other Ambulatory Visit: Payer: Medicare Other

## 2020-02-12 DIAGNOSIS — Z01812 Encounter for preprocedural laboratory examination: Secondary | ICD-10-CM

## 2020-02-12 LAB — BASIC METABOLIC PANEL
BUN/Creatinine Ratio: 18 (ref 10–24)
BUN: 16 mg/dL (ref 8–27)
CO2: 20 mmol/L (ref 20–29)
Calcium: 9.7 mg/dL (ref 8.6–10.2)
Chloride: 101 mmol/L (ref 96–106)
Creatinine, Ser: 0.89 mg/dL (ref 0.76–1.27)
GFR calc Af Amer: 102 mL/min/{1.73_m2} (ref >59)
GFR calc non Af Amer: 88 mL/min/{1.73_m2} (ref >59)
Glucose: 134 mg/dL — ABNORMAL HIGH (ref 65–99)
Potassium: 4.4 mmol/L (ref 3.5–5.2)
Sodium: 137 mmol/L (ref 134–144)

## 2020-02-16 ENCOUNTER — Telehealth (HOSPITAL_COMMUNITY): Payer: Self-pay | Admitting: Emergency Medicine

## 2020-02-16 NOTE — Telephone Encounter (Signed)
Attempted to call patient regarding upcoming cardiac CT appointment. °Left message on voicemail with name and callback number °Benson Porcaro RN Navigator Cardiac Imaging °[facility] °[phone] Office °[phone] Cell ° °

## 2020-02-16 NOTE — Telephone Encounter (Signed)
pts son returning phone call regarding upcoming cardiac imaging study; pt verbalizes understanding of appt date/time, parking situation and where to check in, pre-test NPO status and medications ordered, and verified current allergies; name and call back number provided for further questions should they arise [place] RN Navigator Cardiac Imaging [facility] [phone] office [phone] cell  Pt speaks [place]. Son can help translate if needed

## 2020-02-18 ENCOUNTER — Other Ambulatory Visit: Payer: Self-pay

## 2020-02-18 ENCOUNTER — Ambulatory Visit (HOSPITAL_COMMUNITY)
Admission: RE | Admit: 2020-02-18 | Discharge: 2020-02-18 | Disposition: A | Discharge status: Home / Self Care | Payer: Medicare Other | Source: Ambulatory Visit | Attending: Internal Medicine | Admitting: Internal Medicine

## 2020-02-18 ENCOUNTER — Encounter (HOSPITAL_COMMUNITY): Payer: Self-pay

## 2020-02-18 DIAGNOSIS — I5020 Unspecified systolic (congestive) heart failure: Secondary | ICD-10-CM | POA: Insufficient documentation

## 2020-02-18 DIAGNOSIS — R931 Abnormal findings on diagnostic imaging of heart and coronary circulation: Secondary | ICD-10-CM | POA: Insufficient documentation

## 2020-02-18 MED ORDER — SODIUM CHLORIDE 0.9 % IV BOLUS
250.0000 mL | Freq: Once | INTRAVENOUS | Status: AC
  Administered 2020-02-18: 250 mL via INTRAVENOUS

## 2020-02-18 MED ORDER — NITROGLYCERIN 0.4 MG SL SUBL
SUBLINGUAL_TABLET | SUBLINGUAL | Status: AC
  Filled 2020-02-18: qty 1

## 2020-02-18 MED ORDER — IOHEXOL 350 MG/ML SOLN
80.0000 mL | Freq: Once | INTRAVENOUS | Status: AC | PRN
  Administered 2020-02-18: 80 mL via INTRAVENOUS

## 2020-02-18 MED ORDER — NITROGLYCERIN 0.4 MG SL SUBL
0.8000 mg | SUBLINGUAL_TABLET | Freq: Once | SUBLINGUAL | Status: AC
  Administered 2020-02-18: 0.4 mg via SUBLINGUAL

## 2020-02-19 DIAGNOSIS — I5020 Unspecified systolic (congestive) heart failure: Secondary | ICD-10-CM | POA: Diagnosis not present

## 2020-02-19 DIAGNOSIS — R931 Abnormal findings on diagnostic imaging of heart and coronary circulation: Secondary | ICD-10-CM

## 2020-02-25 ENCOUNTER — Other Ambulatory Visit (HOSPITAL_COMMUNITY): Payer: Medicare Other

## 2020-03-09 ENCOUNTER — Ambulatory Visit: Payer: Medicare Other | Admitting: Internal Medicine

## 2020-03-09 ENCOUNTER — Encounter: Payer: Self-pay | Admitting: Internal Medicine

## 2020-03-09 ENCOUNTER — Other Ambulatory Visit: Payer: Self-pay

## 2020-03-09 VITALS — BP 110/62 | HR 61 | Ht 63.0 in | Wt 158.2 lb

## 2020-03-09 DIAGNOSIS — I700 Atherosclerosis of aorta: Secondary | ICD-10-CM

## 2020-03-09 DIAGNOSIS — E785 Hyperlipidemia, unspecified: Secondary | ICD-10-CM

## 2020-03-09 DIAGNOSIS — I5020 Unspecified systolic (congestive) heart failure: Secondary | ICD-10-CM | POA: Diagnosis not present

## 2020-03-09 DIAGNOSIS — E1169 Type 2 diabetes mellitus with other specified complication: Secondary | ICD-10-CM

## 2020-03-09 DIAGNOSIS — I2510 Atherosclerotic heart disease of native coronary artery without angina pectoris: Secondary | ICD-10-CM

## 2020-03-09 DIAGNOSIS — I25118 Atherosclerotic heart disease of native coronary artery with other forms of angina pectoris: Secondary | ICD-10-CM | POA: Insufficient documentation

## 2020-03-09 LAB — BASIC METABOLIC PANEL
BUN/Creatinine Ratio: 21 (ref 10–24)
BUN: 19 mg/dL (ref 8–27)
CO2: 21 mmol/L (ref 20–29)
Calcium: 9.2 mg/dL (ref 8.6–10.2)
Chloride: 102 mmol/L (ref 96–106)
Creatinine, Ser: 0.91 mg/dL (ref 0.76–1.27)
GFR calc Af Amer: 100 mL/min/{1.73_m2} (ref >59)
GFR calc non Af Amer: 87 mL/min/{1.73_m2} (ref >59)
Glucose: 250 mg/dL — ABNORMAL HIGH (ref 65–99)
Potassium: 4.5 mmol/L (ref 3.5–5.2)
Sodium: 137 mmol/L (ref 134–144)

## 2020-03-09 NOTE — Patient Instructions (Signed)
Medication Instructions:  Your physician has recommended you make the following change in your medication:  STOP: Losartan NOW  *If you need a refill on your cardiac medications before your next appointment, please call your pharmacy*   Lab Work: TODAY: BMP If you have labs (blood work) drawn today and your tests are completely normal, you will receive your results only by: Marland Kitchen MyChart Message (if you have MyChart) OR . A paper copy in the mail If you have any lab test that is abnormal or we need to change your treatment, we will call you to review the results.   Testing/Procedures: Keep appointment for ECHOCARDIOGRAM on Feb. 21, 2022 at 9:20am.   Follow-Up: At [facility], you and your health needs are our priority.  As part of our continuing mission to provide you with exceptional heart care, we have created designated Provider Care Teams.  These Care Teams include your primary Cardiologist (physician) and Advanced Practice Providers (APPs -  Physician Assistants and Nurse Practitioners) who all work together to provide you with the care you need, when you need it.  We recommend signing up for the patient portal called "MyChart".  Sign up information is provided on this After Visit Summary.  MyChart is used to connect with patients for Virtual Visits (Telemedicine).  Patients are able to view lab/test results, encounter notes, upcoming appointments, etc.  Non-urgent messages can be sent to your provider as well.   To learn more about what you can do with MyChart, go to [email or web address].    Your next appointment:   3 month(s)  The format for your next appointment:   In Person  Provider:   You may see Christell Constant, MD or one of the following Advanced Practice Providers on your designated Care Team:    Ronie Spies, PA-C  Jacolyn Reedy, PA-C

## 2020-03-09 NOTE — Progress Notes (Addendum)
Cardiology Office Note:    Date:  03/09/2020   ID:  Bryan Melendez, DOB 1950-10-08, MRN [id number]  PCP:  Soundra Pilon, FNP  [facility] Cardiologist:  Christell Constant, MD  [facility] Electrophysiologist:  None   Referring MD: Soundra Pilon, FNP  CC: Follow up HF  Patient deferred interpretor in lieu of his son - [place] Interpretor number [phone]  History of Present Illness:    Bryan Melendez is a 70 y.o. male with a hx of CAD, Coronary Artery Calcification, Aortic Atherosclerosis, HLD,  T2DM who had a likely NSTEMI 06/2019 in [place], seen in consultation 10/20/19.  Found to have HFrEF (20-25%) 11/12/19 with LAD WMAs and NT-proBNP of 2433.  On GDMT (lasix 20 mg, losartan 25 mg-> Entresto, metoprolol succinate 25 mg), NT-proBNP improved to 837, MR was ~35% with evidence of apical transmural scar.  In interval from 12/25/2019 visit, CCTA showed LAD disease and distal RCA disease.  Patient notes that he is doing well.  Since last visit notes no changes.  Relevant interval testing or therapy include some confusion about medications:  Patient did not stop his losartan; and was talking Entresto.  There are no interval hospital/ED visit.    No chest pain or pressure .  No SOB/DOE and no PND/Orthopnea.  No weight gain or leg swelling.  No palpitations or syncope    Past Medical History:  Diagnosis Date  . Abnormal EKG   . DM (diabetes mellitus) (HCC)   . Dry cough   . Food poisoning   . Hyperlipidemia   . SOB (shortness of breath)   . Thrombocytopenia (HCC)   . [place]omiting    [place]    Past Surgical History:  Procedure Laterality Date  . NO PAST SURGERIES      Current Medications: Current Meds  Medication Sig  . aspirin 81 MG EC tablet Take 1 tablet (81 mg total) by mouth daily. Swallow whole.  . furosemide (LASIX) 20 MG tablet Take 1 tablet (20 mg total) by mouth daily.  . metoprolol succinate (TOPROL XL) 25 MG 24 hr tablet Take 1 tablet (25 mg total) by mouth  daily.  . sacubitril-valsartan (ENTRESTO) 24-26 MG Take 1 tablet by mouth 2 (two) times daily.  . [DISCONTINUED] losartan (COZAAR) 25 MG tablet Take 25 mg by mouth daily.    Allergies:   Patient has no known allergies.   Social History   Socioeconomic History  . Marital status: Married    Spouse name: Not on file  . Number of children: Not on file  . Years of education: Not on file  . Highest education level: Not on file  Occupational History  . Not on file  Tobacco Use  . Smoking status: Never Smoker  . Smokeless tobacco: Never Used  Substance and Sexual Activity  . Alcohol use: Not on file  . Drug use: Not on file  . Sexual activity: Not on file  Other Topics Concern  . Not on file  Social History Narrative  . Not on file   Social Determinants of Health   Financial Resource Strain: Not on file  Food Insecurity: Not on file  Transportation Needs: Not on file  Physical Activity: Not on file  Stress: Not on file  Social Connections: Not on file   Family History: The patient's family history includes Cancer - Prostate in his father; Healthy in his daughter, daughter, daughter, son, and son.  ROS:   Please see the history of present  illness.    All other systems reviewed and are negative.  EKGs/Labs/Other Studies Reviewed:    The following studies were reviewed today:  EKG:  10/08/19 OSH Data from prior visit: 12/25/19: Sinus rhythm, rate 70, anterior Q waves with deep anterior and anterolateral TWI.  Transthoracic Echocardiogram: Date: 11/12/2019 Results: EF 20-25%- concerned for LAD Infarct. 1. There is no apical LV thrombus on contrast imaging. LVEF is severely  reduced 20-25%. The mid septum, all apical segments, and apex are  akinetic. The myocardium is thinned. Overall, this is conerning for a  large wrap around LAD infarction. Left  ventricular ejection fraction, by estimation, is 20 to 25%. The left  ventricle has severely decreased function. The left  ventricle demonstrates  regional wall motion abnormalities (see scoring diagram/findings for  description). Left ventricular diastolic  parameters are consistent with Grade III diastolic dysfunction  (restrictive). Elevated left atrial pressure.  2. Right ventricular systolic function is normal. The right ventricular  size is normal. There is mildly elevated pulmonary artery systolic  pressure. The estimated right ventricular systolic pressure is 43.3 mmHg.  3. Left atrial size was moderately dilated.  4. The mitral valve is degenerative. Trivial mitral valve regurgitation.  No evidence of mitral stenosis.  5. The aortic valve is tricuspid. Aortic valve regurgitation is trivial.  No aortic stenosis is present.  6. The inferior vena cava is dilated in size with >50% respiratory  variability, suggesting right atrial pressure of 8 mmHg.   Conclusion(s)/Recommendation(s): Findings consistent with ischemic  cardiomyopathy.    CardiacCT: Date: 02/18/2020 Results: IMPRESSION: 1. Coronary artery calcium score 174 Agatston units. This places the patient in the 60th percentile for age and gender, suggesting intermediate risk for future cardiac events. 2.  Possible severe distal RCA stenosis. 3.  Possible severe stenosis in a moderate-sized D1. 4. Aortic Atherosclerosis Small right lower lobe pulmonary nodules measuring 5 mm, nonspecific, but statistically likely benign. No follow-up needed if patient is low-risk (and has no known or suspected primary neoplasm). Non-contrast chest CT can be considered in 12 months if patient is high-risk. This recommendation follows the consensus statement: Guidelines for Management of Incidental Pulmonary Nodules Detected on CT Images: From the Fleischner Society 2017; Radiology 2017; 284:228-243.  Cardiac MRI: Date: 12/10/2019 Results: 1. Moderate to severely dilated left ventricle, thin myocardium, and systolic function is moderately reduced  (LVEF =36%). There is mid anterior, anteroseptal, and inferoseptal hypokinesis, as well as apical hypokinesis (all territories). No LV thrombus is noted. There is transmural late gadolinium enhancement: Inferoseptal mid and apex, anteroseptal mid and apex, anterior mid and apex, anterolateral apex, and true apex. These segments are likely nonviable.  LVEDD: 63 mm LVESD: 41 mm LVEDVi:132 mL/m2 SVi:48 mL/m2 CI: 3.08 L/min/m2 Myocardial mass/BSA:77 g/m2  2. Normal right ventricular size, thickness and systolic function (RVEF =58%). There are no regional wall motion abnormalities.  3.  Normal left and right atrial size. LVESV 70 mL RVESV 26 mL  4. Normal size of the aortic root, ascending aorta and pulmonary artery.  5.  No significant valvular abnormalities.  6.  Normal pericardium.  No pericardial effusion.  IMPRESSION: Moderate to severely dilated left ventricle, thin myocardium, and systolic function is moderately reduced (LVEF =36%). There is mid anterior, anteroseptal, and inferoseptal hypokinesis, as well as apical hypokinesis (all territories). No LV thrombus is noted. There is transmural late gadolinium enhancement: Inferoseptal mid and apex, anteroseptal mid and apex, anterior mid and apex, anterolateral apex, and true apex. These segments are likely  nonviable.   Recent Labs: 01/05/2020: Magnesium 1.7; NT-Pro BNP 597 02/12/2020: BUN 16; Creatinine, Ser 0.89; Potassium 4.4; Sodium 137   Physical Exam:    VS:  BP 110/62   Pulse 61   Ht 5\' 3"  (1.6 m)   Wt 158 lb 3.2 oz (71.8 kg)   SpO2 98%   BMI 28.02 kg/m     Wt Readings from Last 3 Encounters:  03/09/20 158 lb 3.2 oz (71.8 kg)  12/25/19 159 lb (72.1 kg)  11/14/19 157 lb (71.2 kg)    GEN: Well nourished, well developed in no acute distress HEENT: Normal NECK: No JVD; No carotid bruits LYMPHATICS: No lymphadenopathy CARDIAC: RRR, no  rubs, gallops; There is a new holosystolic murmur  II/VI RESPIRATORY:  Clear to auscultation without rales, wheezing or rhonchi  ABDOMEN: Soft, non-tender, non-distended MUSCULOSKELETAL:  No edema; No deformity  SKIN: Warm and dry NEUROLOGIC:  Alert and oriented x 3 PSYCHIATRIC:  Normal affect   ASSESSMENT:    1. HFrEF (heart failure with reduced ejection fraction) (HCC)   2. Type 2 diabetes mellitus with hyperlipidemia (HCC)   3. Coronary artery disease involving native coronary artery of native heart without angina pectoris   4. Hyperlipidemia, unspecified hyperlipidemia type   5. Aortic atherosclerosis (HCC)    PLAN:    In order of problems listed above:  Heart Failure with reduced ejection fraction; EF 35% - NYHA class I, Stage B, euvolemic, likely coronary mediated - Will continue lasix 20 mg po Daily - ASA 81 mg  - metoprolol succinate 25 mg PO Daily -will continue ARNI 24-26 mg (will stop losartan- miscommunication with patient) - Discussed daily weights, and fluid restriction of < 2 L  - Check BMP - Will keep 2/21 Echo - holding SGLT2i and MRA in the setting of ARB and ARNI miscommunication  - Continue Ambulatory BP cuff monitoring - Patient not interested in cardiac rehab   Coronary Artery Disease; Moderate disease HLD Aortic atherosclerosis - asymptomatic  - anatomy: D1 disease, distal LAD disease, distal RCA disease - continue ASA 81 mg - continue statin, goal LDL < 70 - continue BB  SHARED DECISION MAKING:  Discussed at length the risk and benefits of LHC to define his anatomy in the setting of non viable territory in the LAD, D1 Disease, and RCA disease.  Discussed Risks and benefits of cardiac catheterization have been discussed with the patient.  These include bleeding, infection, kidney damage, stroke, heart attack, death.  Discussed possible PCI, pand how this may or may not increase his EF and improve sx.  Discussed alternatives (GDMT only).  Discussed at length with patient and son.   Patient would prefer  conservative therapy at this time.  Three months follow up unless new symptoms or abnormal test results warranting change in plan  Would be reasonable for  Video Visit Follow up Would be reasonable for  APP Follow up  ADDENDUM:  At next visit will send to Pulmonary [facility]odule [facility]   Medication Adjustments/Labs and Tests Ordered: Current medicines are reviewed at length with the patient today.  Concerns regarding medicines are outlined above.  Orders Placed This Encounter  Procedures  . Basic metabolic panel   No orders of the defined types were placed in this encounter.   Patient Instructions  Medication Instructions:  Your physician has recommended you make the following change in your medication:  STOP: Losartan NOW  *If you need a refill on your cardiac medications before your next appointment, please  call your pharmacy*   Lab Work: TODAY: BMP If you have labs (blood work) drawn today and your tests are completely normal, you will receive your results only by: Marland Kitchen MyChart Message (if you have MyChart) OR . A paper copy in the mail If you have any lab test that is abnormal or we need to change your treatment, we will call you to review the results.   Testing/Procedures: Keep appointment for ECHOCARDIOGRAM on Feb. 21, 2022 at 9:20am.   Follow-Up: At [facility], you and your health needs are our priority.  As part of our continuing mission to provide you with exceptional heart care, we have created designated Provider Care Teams.  These Care Teams include your primary Cardiologist (physician) and Advanced Practice Providers (APPs -  Physician Assistants and Nurse Practitioners) who all work together to provide you with the care you need, when you need it.  We recommend signing up for the patient portal called "MyChart".  Sign up information is provided on this After Visit Summary.  MyChart is used to connect with patients for Virtual Visits (Telemedicine).  Patients are able  to view lab/test results, encounter notes, upcoming appointments, etc.  Non-urgent messages can be sent to your provider as well.   To learn more about what you can do with MyChart, go to [email or web address].    Your next appointment:   3 month(s)  The format for your next appointment:   In Person  Provider:   You may see Christell Constant, MD or one of the following Advanced Practice Providers on your designated Care Team:    Ronie Spies, PA-C  Jacolyn Reedy, PA-C          Signed, Christell Constant, MD  03/09/2020 9:33 AM    [facility]

## 2020-03-29 ENCOUNTER — Ambulatory Visit (HOSPITAL_COMMUNITY): Payer: Medicare Other | Attending: Internal Medicine

## 2020-03-29 ENCOUNTER — Other Ambulatory Visit: Payer: Self-pay

## 2020-03-29 DIAGNOSIS — I5020 Unspecified systolic (congestive) heart failure: Secondary | ICD-10-CM | POA: Insufficient documentation

## 2020-03-29 LAB — ECHOCARDIOGRAM COMPLETE
Area-P 1/2: 3.6 cm2
S' Lateral: 5.35 cm

## 2020-03-29 MED ORDER — PERFLUTREN LIPID MICROSPHERE
1.0000 mL | INTRAVENOUS | Status: AC | PRN
  Administered 2020-03-29: 1 mL via INTRAVENOUS

## 2020-04-16 ENCOUNTER — Ambulatory Visit: Payer: Medicare Other | Admitting: Internal Medicine

## 2020-06-07 NOTE — Progress Notes (Signed)
Cardiology Office Note:    Date:  06/08/2020   ID:  Bryan Melendez, DOB 01/30/1951, MRN [id number]  PCP:  Soundra PilonBrake, Andrew R, FNP  [facility] Cardiologist:  Christell ConstantMahesh A Irja Wheless, MD  [facility] Electrophysiologist:  None   Referring MD: Soundra PilonBrake, Andrew R, FNP  CC: Follow up HF  Patient deferred interpretor in lieu of his son (almost always makes this request) - [place] Interpretor number [phone]  History of Present Illness:    Bryan Sensingsni Terrilee Melendez is a 70 y.o. male with a hx of Coronary Artery Disease, Aortic Atherosclerosis & HLD &  T2DM who had a likely [place] 06[place], seen in consultation 10/20/19.  Found to have HFrEF (20-25%) 11/12/19 with LAD WMAs and NT-proBNP of 2433.  On GDMT (lasix 20 mg, losartan 25 mg-> Entresto, metoprolol succinate 25 mg), NT-proBNP improved to 837, MR was ~35% with evidence of apical transmural scar.  In interval from 12/25/2019 visit, CCTA showed LAD disease and distal RCA disease.  In interim of this visit, patient has echo without significant change in EF~ 30%.  Since last visit patient's EF similar to prior echo.  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  Since last visit notes no changes.   There are no interval hospital/ED visit.    No chest pain or pressure.  No SOB/DOE and no PND/Orthopnea.  No weight gain or leg swelling.  No palpitations or syncope.  Does note a dry cough.  Notes that he had this cough when he had his heart attack in [place].     Past Medical History:  Diagnosis Date  . Abnormal EKG   . DM (diabetes mellitus) (HCC)   . Dry cough   . Food poisoning   . Hyperlipidemia   . SOB (shortness of breath)   . Thrombocytopenia (HCC)   . [place]omiting    [place]    Past Surgical History:  Procedure Laterality Date  . NO PAST SURGERIES      Current Medications: Current Meds  Medication Sig  . aspirin 81 MG EC tablet Take 1 tablet (81 mg total) by mouth daily. Swallow whole.  Marland Kitchen. atorvastatin  (LIPITOR) 40 MG tablet Take 1 tablet by mouth daily.  . furosemide (LASIX) 20 MG tablet Take 1 tablet (20 mg total) by mouth daily.  Marland Kitchen. glipiZIDE-metformin (METAGLIP) 5-500 MG tablet Take 2 tablets by mouth 2 (two) times daily.  Marland Kitchen. losartan (COZAAR) 25 MG tablet Take 25 mg by mouth daily.  . metoprolol succinate (TOPROL XL) 25 MG 24 hr tablet Take 1 tablet (25 mg total) by mouth daily.  . sacubitril-valsartan (ENTRESTO) 24-26 MG Take 1 tablet by mouth 2 (two) times daily.    Allergies:   Patient has no known allergies.   Social History   Socioeconomic History  . Marital status: Married    Spouse name: Not on file  . Number of children: Not on file  . Years of education: Not on file  . Highest education level: Not on file  Occupational History  . Not on file  Tobacco Use  . Smoking status: Never Smoker  . Smokeless tobacco: Never Used  Substance and Sexual Activity  . Alcohol use: Not on file  . Drug use: Not on file  . Sexual activity: Not on file  Other Topics Concern  . Not on file  Social History Narrative  . Not on file   Social Determinants of Health   Financial Resource Strain: Not on  file  Food Insecurity: Not on file  Transportation Needs: Not on file  Physical Activity: Not on file  Stress: Not on file  Social Connections: Not on file   Family History: The patient's family history includes Cancer - Prostate in his father; Healthy in his daughter, daughter, daughter, son, and son.  ROS:   Please see the history of present illness.    All other systems reviewed and are negative.  EKGs/Labs/Other Studies Reviewed:    The following studies were reviewed today:  EKG:  10/08/19 OSH Data from prior visit: 12/25/19: Sinus rhythm, rate 70, anterior Q waves with deep anterior and anterolateral TWI.  Transthoracic Echocardiogram: Date: 11/12/2019 Results: EF 20-25%- concerned for LAD Infarct. 1. There is no apical LV thrombus on contrast imaging. LVEF is severely   reduced 20-25%. The mid septum, all apical segments, and apex are  akinetic. The myocardium is thinned. Overall, this is conerning for a  large wrap around LAD infarction. Left  ventricular ejection fraction, by estimation, is 20 to 25%. The left  ventricle has severely decreased function. The left ventricle demonstrates  regional wall motion abnormalities (see scoring diagram/findings for  description). Left ventricular diastolic  parameters are consistent with Grade III diastolic dysfunction  (restrictive). Elevated left atrial pressure.  2. Right ventricular systolic function is normal. The right ventricular  size is normal. There is mildly elevated pulmonary artery systolic  pressure. The estimated right ventricular systolic pressure is 43.3 mmHg.  3. Left atrial size was moderately dilated.  4. The mitral valve is degenerative. Trivial mitral valve regurgitation.  No evidence of mitral stenosis.  5. The aortic valve is tricuspid. Aortic valve regurgitation is trivial.  No aortic stenosis is present.  6. The inferior vena cava is dilated in size with >50% respiratory  variability, suggesting right atrial pressure of 8 mmHg.   Conclusion(s)/Recommendation(s): Findings consistent with ischemic  cardiomyopathy.   Date: 03/29/20 Results:  1. LVEF remains severely reduced ~25-30%. The mid septum and apical  segments are akineitic consistent with LAD infarction. No LV thrombus. EF  roughly the same. Left ventricular ejection fraction, by estimation, is 25  to 30%. The left ventricle has  severely decreased function. The left ventricle demonstrates regional wall  motion abnormalities (see scoring diagram/findings for description). The  left ventricular internal cavity size was mildly to moderately dilated.  Left ventricular diastolic  parameters are consistent with Grade I diastolic dysfunction (impaired  relaxation).  2. Right ventricular systolic function is normal. The right  ventricular  size is normal. There is normal pulmonary artery systolic pressure. The  estimated right ventricular systolic pressure is 34.4 mmHg.  3. The mitral valve is grossly normal. Trivial mitral valve  regurgitation. No evidence of mitral stenosis.  4. The aortic valve is tricuspid. Aortic valve regurgitation is not  visualized. No aortic stenosis is present.  5. The inferior vena cava is dilated in size with >50% respiratory  variability, suggesting right atrial pressure of 8 mmHg.   CardiacCT: Date: 02/18/2020 Results: IMPRESSION: 1. Coronary artery calcium score 174 Agatston units. This places the patient in the 60th percentile for age and gender, suggesting intermediate risk for future cardiac events. 2.  Possible severe distal RCA stenosis. 3.  Possible severe stenosis in a moderate-sized D1. 4. Aortic Atherosclerosis Small right lower lobe pulmonary nodules measuring 5 mm, nonspecific, but statistically likely benign. No follow-up needed if patient is low-risk (and has no known or suspected primary neoplasm). Non-contrast chest CT can be considered  in 12 months if patient is high-risk. This recommendation follows the consensus statement: Guidelines for Management of Incidental Pulmonary Nodules Detected on CT Images: From the Fleischner Society 2017; Radiology 2017; 284:228-243.  Cardiac MRI: Date: 12/10/2019 Results: 1. Moderate to severely dilated left ventricle, thin myocardium, and systolic function is moderately reduced (LVEF =36%). There is mid anterior, anteroseptal, and inferoseptal hypokinesis, as well as apical hypokinesis (all territories). No LV thrombus is noted. There is transmural late gadolinium enhancement: Inferoseptal mid and apex, anteroseptal mid and apex, anterior mid and apex, anterolateral apex, and true apex. These segments are likely nonviable.  LVEDD: 63 mm LVESD: 41 mm LVEDVi:132 mL/m2 SVi:48 mL/m2 CI: 3.08  L/min/m2 Myocardial mass/BSA:77 g/m2  2. Normal right ventricular size, thickness and systolic function (RVEF =58%). There are no regional wall motion abnormalities.  3.  Normal left and right atrial size. LVESV 70 mL RVESV 26 mL  4. Normal size of the aortic root, ascending aorta and pulmonary artery.  5.  No significant valvular abnormalities.  6.  Normal pericardium.  No pericardial effusion.  IMPRESSION: Moderate to severely dilated left ventricle, thin myocardium, and systolic function is moderately reduced (LVEF =36%). There is mid anterior, anteroseptal, and inferoseptal hypokinesis, as well as apical hypokinesis (all territories). No LV thrombus is noted. There is transmural late gadolinium enhancement: Inferoseptal mid and apex, anteroseptal mid and apex, anterior mid and apex, anterolateral apex, and true apex. These segments are likely nonviable.   Recent Labs: 01/05/2020: Magnesium 1.7; NT-Pro BNP 597 03/09/2020: BUN 19; Creatinine, Ser 0.91; Potassium 4.5; Sodium 137   Physical Exam:    VS:  BP (!) 100/58   Pulse (!) 55   Ht 5\' 4"  (1.626 m)   Wt 159 lb (72.1 kg)   SpO2 97%   BMI 27.29 kg/m     Wt Readings from Last 3 Encounters:  06/08/20 159 lb (72.1 kg)  03/09/20 158 lb 3.2 oz (71.8 kg)  12/25/19 159 lb (72.1 kg)    GEN: Well nourished, well developed in no acute distress HEENT: Normal NECK: No JVD; No carotid bruits LYMPHATICS: No lymphadenopathy CARDIAC: RRR, no  rubs, gallops; no murmurs RESPIRATORY:  Clear to auscultation without rales, wheezing or rhonchi  ABDOMEN: Soft, non-tender, non-distended MUSCULOSKELETAL:  No edema; No deformity  SKIN: Warm and dry NEUROLOGIC:  Alert and oriented x 3 PSYCHIATRIC:  Normal affect   ASSESSMENT:    1. Coronary artery disease involving native coronary artery of native heart without angina pectoris   2. Aortic atherosclerosis (HCC)   3. HFrEF (heart failure with reduced ejection fraction)  (HCC)   4. Type 2 diabetes mellitus with hyperlipidemia (HCC)   5. Hyperlipidemia, unspecified hyperlipidemia type    PLAN:    In order of problems listed above:  Heart Failure with reduced ejection fraction; EF 30-35% DM - NYHA class I, Stage B, euvolemic, likely coronary mediated - Will continue lasix 20 mg po Daily - ASA 81 mg  - metoprolol succinate 25 mg PO Daily - ARNI 24-26 mg  - Discussed daily weights, and fluid restriction of < 2 L  - Offering SGLT2i today - Continue Ambulatory BP cuff monitoring - ICD discussions as bellow  Coronary Artery Disease; Moderate disease HLD Aortic atherosclerosis - asymptomatic  - anatomy: D1 disease, distal LAD disease, distal RCA disease - continue ASA 81 mg - re-offered statin, goal LDL < 70 - continue BB  Pulm Nodule: will refer to Pulmonary Nodule Clinic   SHARED DECISION MAKING:  Discussed at length the risk and benefits of GDMT and that adding medications does not mean he is worsening in his condition.  We had discussed primary prevention ICD but he is not interested at this time.  We re-addressed procedural intervention for LHC and patient again deferred. Discussed the progression of disease and that his LDL is not at goal (he has stopped his prior atorvastatin)  Did not have issues with it prior but he is on too many medications.  Does not want to restart at this time.  Six months follow up unless new symptoms or abnormal test results warranting change in plan  Would be reasonable for  Video Visit Follow up Would be reasonable for  APP Follow up  Time Spent Directly with Patient:   I have spent a total of 40 minutes with the patient reviewing notes, imaging, EKGs, labs and examining the patient as well as establishing an assessment and plan that was discussed personally with the patient.  > 50% of time was spent in direct patient care and son.    Medication Adjustments/Labs and Tests Ordered: Current medicines are reviewed  at length with the patient today.  Concerns regarding medicines are outlined above.  No orders of the defined types were placed in this encounter.  No orders of the defined types were placed in this encounter.   There are no Patient Instructions on file for this visit.   Signed, Christell Constant, MD  06/08/2020 9:04 AM    [facility]

## 2020-06-08 ENCOUNTER — Ambulatory Visit: Payer: Medicare Other | Admitting: Internal Medicine

## 2020-06-08 ENCOUNTER — Encounter: Payer: Self-pay | Admitting: Internal Medicine

## 2020-06-08 ENCOUNTER — Encounter: Payer: Self-pay | Admitting: *Deleted

## 2020-06-08 ENCOUNTER — Other Ambulatory Visit: Payer: Self-pay

## 2020-06-08 VITALS — BP 100/58 | HR 55 | Ht 64.0 in | Wt 159.0 lb

## 2020-06-08 DIAGNOSIS — I2510 Atherosclerotic heart disease of native coronary artery without angina pectoris: Secondary | ICD-10-CM

## 2020-06-08 DIAGNOSIS — I700 Atherosclerosis of aorta: Secondary | ICD-10-CM

## 2020-06-08 DIAGNOSIS — R911 Solitary pulmonary nodule: Secondary | ICD-10-CM

## 2020-06-08 DIAGNOSIS — E1169 Type 2 diabetes mellitus with other specified complication: Secondary | ICD-10-CM | POA: Diagnosis not present

## 2020-06-08 DIAGNOSIS — I5020 Unspecified systolic (congestive) heart failure: Secondary | ICD-10-CM | POA: Diagnosis not present

## 2020-06-08 DIAGNOSIS — E785 Hyperlipidemia, unspecified: Secondary | ICD-10-CM

## 2020-06-08 MED ORDER — DAPAGLIFLOZIN PROPANEDIOL 10 MG PO TABS: 10.0000 mg | ORAL_TABLET | Freq: Every day | ORAL | 3 refills | Status: DC

## 2020-06-08 NOTE — Patient Instructions (Signed)
Medication Instructions:  Your physician has recommended you make the following change in your medication:   START: dapagliflozin propanediol (Farxiga) 10 mg by mouth daily before breakfast   *If you need a refill on your cardiac medications before your next appointment, please call your pharmacy*   Lab Work: NONE If you have labs (blood work) drawn today and your tests are completely normal, you will receive your results only by: Marland Kitchen MyChart Message (if you have MyChart) OR . A paper copy in the mail If you have any lab test that is abnormal or we need to change your treatment, we will call you to review the results.   Testing/Procedures: Your physician has referred you to the Pulmonary nodule clinic.    Follow-Up: At [facility], you and your health needs are our priority.  As part of our continuing mission to provide you with exceptional heart care, we have created designated Provider Care Teams.  These Care Teams include your primary Cardiologist (physician) and Advanced Practice Providers (APPs -  Physician Assistants and Nurse Practitioners) who all work together to provide you with the care you need, when you need it.  We recommend signing up for the patient portal called "MyChart".  Sign up information is provided on this After Visit Summary.  MyChart is used to connect with patients for Virtual Visits (Telemedicine).  Patients are able to view lab/test results, encounter notes, upcoming appointments, etc.  Non-urgent messages can be sent to your provider as well.   To learn more about what you can do with MyChart, go to [email or web address].    Your next appointment:   6 month(s)  The format for your next appointment:   In Person  Provider:   You may see Christell Constant, MD or one of the following Advanced Practice Providers on your designated Care Team:    Ronie Spies, PA-C  Jacolyn Reedy, PA-C

## 2020-10-20 ENCOUNTER — Telehealth: Payer: Self-pay | Admitting: Internal Medicine

## 2020-10-20 NOTE — Telephone Encounter (Signed)
Called pt using interpreter services [phone]. Son answered phone ok to speak with him per [facility].  Interpreter not needed.  Pt has expressed that [place] cause him to urinate more than normal and burning with urination.  I advised him to stop taking medication.  No medication will replace this medication at this time. F/u OV scheduled for 01/19/21.  Son expresses understanding.  No further questions or concerns.

## 2020-10-20 NOTE — Telephone Encounter (Signed)
Pt c/o medication issue:  1. Name of Medication: Farxiga  2. How are you currently taking this medication (dosage and times per day)? 1 time a day  3. Are you having a reaction (difficulty breathing--STAT)? no  4. What is your medication issue? Using the bathroom a lot pain sometimes when using the bathroom, fatigued, je just does not feel right

## 2020-11-10 ENCOUNTER — Telehealth: Payer: Self-pay | Admitting: Internal Medicine

## 2020-11-10 NOTE — Telephone Encounter (Signed)
I called pt back and informed him that MD is not in the office today.  [place] is planning to travel [place] in the next couple of [place].  I also inquired about losartan and entresto.  He expresses that he is no longer taking entresto.  Will route this message to MD to review.

## 2020-11-10 NOTE — Telephone Encounter (Signed)
Pt would like to know is it safe to travel overseas due to him having a heart attack the last time he did so... please advise

## 2020-11-14 ENCOUNTER — Other Ambulatory Visit: Payer: Self-pay | Admitting: Internal Medicine

## 2020-11-15 NOTE — Telephone Encounter (Signed)
Called and left a message for pt to call back.

## 2020-11-15 NOTE — Telephone Encounter (Signed)
Spoke with pt son Mance Vallejo ok per chart.  Informed him of MD recommendation in regards to overseas travel.  Also recommend compression socks and frequent breaks to move around while on plane.  Son reports that pt is taking all meds as prescribed except Entresto; pt could not tolerate medication.  Medication removed from med list.

## 2020-12-15 ENCOUNTER — Other Ambulatory Visit: Payer: Self-pay | Admitting: Internal Medicine

## 2021-01-18 NOTE — Progress Notes (Signed)
Cardiology Office Note:    Date:  01/19/2021   ID:  Bryan Melendez, DOB October 29, 1950, MRN [id number]  PCP:  Kristen Loader, FNP  [facility] Cardiologist:  Werner Lean, MD  [facility] Electrophysiologist:  None   Referring MD: Kristen Loader, FNP  CC: Follow up HF  Patient deferred interpretor in lieu of his son (often makes this request) - [place] Interpretor number [id number] (occasionally is amenable)  History of Present Illness:    Bryan Melendez is a 70 y.o. male with a hx of Coronary Artery Disease, Aortic Atherosclerosis & HLD &  T2DM who had a likely NSTEMI 06/2019 in [place], seen in consultation 10/20/19.  Found to have HFrEF (20-25%) 11/12/19 with LAD WMAs and NT-proBNP of 2433.  On GDMT (lasix 20 mg, losartan 25 mg-> Entresto, metoprolol succinate 25 mg), NT-proBNP improved to 837, MR was ~35% with evidence of apical transmural scar.  In interval from 12/25/2019 visit, CCTA showed LAD disease and distal RCA disease.  In interim of this visit, patient has echo without significant change in EF~ 30%.  Was unable to tolerate Entresto due to hypotensions.  Went back to [place].  Seen 01/19/21.  Patient notes that he is doing staying busy and working.  Went to [place] and he had no events.  Had a great time.   There are no interval hospital/ED visit.    No chest pain or pressure .  No SOB/DOE and no PND/Orthopnea.  Notes some weight gain (6 lbs) but no leg swelling.  No palpitations or syncope.  Ambulatory blood pressure .    Past Medical History:  Diagnosis Date   Abnormal EKG    DM (diabetes mellitus) (HCC)    Dry cough    Food poisoning    Hyperlipidemia    SOB (shortness of breath)    Thrombocytopenia (HCC)    [place]iting    [place]    Past Surgical History:  Procedure Laterality Date   NO PAST SURGERIES      Current Medications: Current Meds  Medication Sig   aspirin 81 MG EC tablet Take 1 tablet (81 mg total) by mouth daily. Swallow whole.    atorvastatin (LIPITOR) 40 MG tablet Take 1 tablet by mouth daily.   empagliflozin (JARDIANCE) 10 MG TABS tablet Take 1 tablet (10 mg total) by mouth daily before breakfast.   ENTRESTO 24-26 MG Take 1 tablet by mouth 2 (two) times daily.   furosemide (LASIX) 20 MG tablet TAKE 1 TABLET BY MOUTH EVERY DAY   glipiZIDE-metformin (METAGLIP) 5-500 MG tablet Take 2 tablets by mouth 2 (two) times daily.   metoprolol succinate (TOPROL-XL) 25 MG 24 hr tablet TAKE 1 TABLET BY MOUTH EVERY DAY   pioglitazone (ACTOS) 30 MG tablet Take 30 mg by mouth daily.   [DISCONTINUED] losartan (COZAAR) 25 MG tablet TAKE 1 TABLET BY MOUTH EVERY DAY    Allergies:   Patient has no known allergies.   Social History   Socioeconomic History   Marital status: Married    Spouse name: Not on file   Number of children: Not on file   Years of education: Not on file   Highest education level: Not on file  Occupational History   Not on file  Tobacco Use   Smoking status: Never   Smokeless tobacco: Never  Vaping Use   Vaping Use: Every day  Substance and Sexual Activity   Alcohol use: Not on file   Drug use: Not on file  Sexual activity: Not on file  Other Topics Concern   Not on file  Social History Narrative   Not on file   Social Determinants of Health   Financial Resource Strain: Not on file  Food Insecurity: Not on file  Transportation Needs: Not on file  Physical Activity: Not on file  Stress: Not on file  Social Connections: Not on file   Family History: The patient's family history includes Cancer - Prostate in his father; Healthy in his daughter, daughter, daughter, son, and son.  ROS:   Please see the history of present illness.    All other systems reviewed and are negative.  EKGs/Labs/Other Studies Reviewed:    The following studies were reviewed today:  EKG:  01/18/21: Sinus bradycardia rate 56 low voltage and anterior infract 12/25/19: Sinus rhythm, rate 70, anterior Q waves with deep  anterior and anterolateral TWI.  Transthoracic Echocardiogram: Date: 03/29/20 Results:  1. LVEF remains severely reduced ~25-30%. The mid septum and apical  segments are akineitic consistent with LAD infarction. No LV thrombus. EF  roughly the same. Left ventricular ejection fraction, by estimation, is 25  to 30%. The left ventricle has  severely decreased function. The left ventricle demonstrates regional wall  motion abnormalities (see scoring diagram/findings for description). The  left ventricular internal cavity size was mildly to moderately dilated.  Left ventricular diastolic  parameters are consistent with Grade I diastolic dysfunction (impaired  relaxation).   2. Right ventricular systolic function is normal. The right ventricular  size is normal. There is normal pulmonary artery systolic pressure. The  estimated right ventricular systolic pressure is 34.4 mmHg.   3. The mitral valve is grossly normal. Trivial mitral valve  regurgitation. No evidence of mitral stenosis.   4. The aortic valve is tricuspid. Aortic valve regurgitation is not  visualized. No aortic stenosis is present.   5. The inferior vena cava is dilated in size with >50% respiratory  variability, suggesting right atrial pressure of 8 mmHg.   CardiacCT: Date: 02/18/2020 Results: IMPRESSION: 1. Coronary artery calcium score 174 Agatston units. This places the patient in the 60th percentile for age and gender, suggesting intermediate risk for future cardiac events.  2.  Possible severe distal RCA stenosis.  3.  Possible severe stenosis in a moderate-sized D1. 4. Aortic Atherosclerosis  Small right lower lobe pulmonary nodules measuring 5 mm, nonspecific, but statistically likely benign. No follow-up needed if patient is low-risk (and has no known or suspected primary neoplasm). Non-contrast chest CT can be considered in 12 months if patient is high-risk. This recommendation follows the consensus statement:  Guidelines for Management of Incidental Pulmonary Nodules Detected on CT Images: From the Fleischner Society 2017; Radiology 2017; 284:228-243.  Cardiac MRI: Date: 12/10/2019 Results: 1. Moderate to severely dilated left ventricle, thin myocardium, and systolic function is moderately reduced (LVEF =36%). There is mid anterior, anteroseptal, and inferoseptal hypokinesis, as well as apical hypokinesis (all territories). No LV thrombus is noted. There is transmural late gadolinium enhancement: Inferoseptal mid and apex, anteroseptal mid and apex, anterior mid and apex, anterolateral apex, and true apex. These segments are likely nonviable.   LVEDD: 63 mm  LVESD: 41 mm  LVEDVi:132 mL/m2  SVi:48 mL/m2  CI: 3.08 L/min/m2  Myocardial mass/BSA:77 g/m2   2. Normal right ventricular size, thickness and systolic function (RVEF =58%). There are no regional wall motion abnormalities.   3.  Normal left and right atrial size.  LVESV 70 mL  RVESV 26 mL  4. Normal size of the aortic root, ascending aorta and pulmonary artery.   5.  No significant valvular abnormalities.   6.  Normal pericardium.  No pericardial effusion.   IMPRESSION: Moderate to severely dilated left ventricle, thin myocardium, and systolic function is moderately reduced (LVEF =36%). There is mid anterior, anteroseptal, and inferoseptal hypokinesis, as well as apical hypokinesis (all territories). No LV thrombus is noted. There is transmural late gadolinium enhancement: Inferoseptal mid and apex, anteroseptal mid and apex, anterior mid and apex, anterolateral apex, and true apex. These segments are likely nonviable.   Recent Labs: 03/09/2020: BUN 19; Creatinine, Ser 0.91; Potassium 4.5; Sodium 137   Physical Exam:    VS:  BP (!) 104/58    Pulse (!) 56    Ht 5\' 4"  (1.626 m)    Wt 74.8 kg    SpO2 98%    BMI 28.32 kg/m     Wt Readings from Last 3 Encounters:  01/19/21 74.8 kg  06/08/20 72.1 kg  03/09/20 71.8 kg     Gen: no distress   Neck: No JVD,  Ears:  Bilateral Pilar Plate Sign Cardiac: No Rubs or Gallops, no Murmur, regular rhythm, +2 radial pulses Respiratory: Clear to auscultation bilaterally, normal effort, normal  respiratory rate GI: Soft, nontender, non-distended  MS: +1 bilateral edema;  moves all extremities Integument: Skin feels warm Neuro:  At time of evaluation, alert and oriented to person/place/time/situation  Psych: Normal affect, patient feels    ASSESSMENT:    1. Coronary artery disease involving native coronary artery of native heart without angina pectoris   2. HFrEF (heart failure with reduced ejection fraction) ([place])   3. Aortic atherosclerosis (HCC)     PLAN:    In order of problems listed above:  Heart Failure with reduced ejection fraction; EF 30 DM - NYHA class I, Stage B, slightly hypervolemic today, likely coronary mediated - Will continue lasix 20 mg po Daily - Jardiance 10 or equivalent SGLT2i  - metoprolol succinate 25 mg PO Daily - Discussed daily weights, and fluid restriction of < 2 L  - medication confusion (he was taking both losartan AND Entresto-> changing back to just Entresto - There have been some confusion about medications related to CAD and HF; we will refer him to Phamd D clinic  Coronary Artery Disease; Moderate disease HLD Aortic atherosclerosis - asymptomatic  - anatomy: D1 disease, distal LAD disease, distal RCA disease - continue ASA 81 mg - has restarted atorvastatin 40 , goal LDL < 55 - continue metoprolol succinate 25  Please see prior notes in regard to his deferral of EP eval for ICD discussions, and about LHC for potential revascularization  Sending copy or our Note to Mr. Dayna Ramus, [place].  SGLT2i may be favored over Pioglitazone in the setting of HFrEF  Six months with me  Medication Adjustments/Labs and Tests Ordered: Current medicines are reviewed at length with the patient today.  Concerns regarding medicines are outlined  above.  Orders Placed This Encounter  Procedures   Basic metabolic panel   Lipid panel   Magnesium   Pro b natriuretic peptide (BNP)   AMB Referral to [facility] Pharm-D   EKG 12-Lead    Meds ordered this encounter  Medications   empagliflozin (JARDIANCE) 10 MG TABS tablet    Sig: Take 1 tablet (10 mg total) by mouth daily before breakfast.    Dispense:  90 tablet    Refill:  3     Patient Instructions  Medication  Instructions:  Your physician has recommended you make the following change in your medication:   START: empagliflozin (Jardiance) 10 mg by mouth daily   STOP: losartan  CONTINUE: Entresto 24-26 mg twice daily  *If you need a refill on your cardiac medications before your next appointment, please call your pharmacy*   Lab Work: TODAY: BMP, BNP, Mg, FLP If you have labs (blood work) drawn today and your tests are completely normal, you will receive your results only by: MyChart Message (if you have MyChart) OR A paper copy in the mail If you have any lab test that is abnormal or we need to change your treatment, we will call you to review the results.   Testing/Procedures: Your physician has referred you to see the Lipid Clinic.    Follow-Up: At [facility], you and your health needs are our priority.  As part of our continuing mission to provide you with exceptional heart care, we have created designated Provider Care Teams.  These Care Teams include your primary Cardiologist (physician) and Advanced Practice Providers (APPs -  Physician Assistants and Nurse Practitioners) who all work together to provide you with the care you need, when you need it.  We recommend signing up for the patient portal called "MyChart".  Sign up information is provided on this After Visit Summary.  MyChart is used to connect with patients for Virtual Visits (Telemedicine).  Patients are able to view lab/test results, encounter notes, upcoming appointments, etc.  Non-urgent  messages can be sent to your provider as well.   To learn more about what you can do with MyChart, go to [email or web address].    Your next appointment:   6 month(s)  The format for your next appointment:   In Person  Provider:   Christell Constant, MD         Signed, Christell Constant, MD  01/19/2021 9:20 AM    [facility]

## 2021-01-19 ENCOUNTER — Other Ambulatory Visit: Payer: Self-pay

## 2021-01-19 ENCOUNTER — Encounter: Payer: Self-pay | Admitting: Internal Medicine

## 2021-01-19 ENCOUNTER — Ambulatory Visit: Payer: Medicare Other | Admitting: Internal Medicine

## 2021-01-19 VITALS — BP 104/58 | HR 56 | Ht 64.0 in | Wt 165.0 lb

## 2021-01-19 DIAGNOSIS — I700 Atherosclerosis of aorta: Secondary | ICD-10-CM

## 2021-01-19 DIAGNOSIS — I5020 Unspecified systolic (congestive) heart failure: Secondary | ICD-10-CM | POA: Diagnosis not present

## 2021-01-19 DIAGNOSIS — I2510 Atherosclerotic heart disease of native coronary artery without angina pectoris: Secondary | ICD-10-CM

## 2021-01-19 MED ORDER — EMPAGLIFLOZIN 10 MG PO TABS: 10.0000 mg | ORAL_TABLET | Freq: Every day | ORAL | 3 refills | Status: DC

## 2021-01-19 NOTE — Patient Instructions (Addendum)
Medication Instructions:  Your physician has recommended you make the following change in your medication:   START: empagliflozin (Jardiance) 10 mg by mouth daily   STOP: losartan  CONTINUE: Entresto 24-26 mg twice daily  *If you need a refill on your cardiac medications before your next appointment, please call your pharmacy*   Lab Work: TODAY: BMP, BNP, Mg, FLP If you have labs (blood work) drawn today and your tests are completely normal, you will receive your results only by: MyChart Message (if you have MyChart) OR A paper copy in the mail If you have any lab test that is abnormal or we need to change your treatment, we will call you to review the results.   Testing/Procedures: Your physician has referred you to see the Lipid Clinic.    Follow-Up: At [facility], you and your health needs are our priority.  As part of our continuing mission to provide you with exceptional heart care, we have created designated Provider Care Teams.  These Care Teams include your primary Cardiologist (physician) and Advanced Practice Providers (APPs -  Physician Assistants and Nurse Practitioners) who all work together to provide you with the care you need, when you need it.  We recommend signing up for the patient portal called "MyChart".  Sign up information is provided on this After Visit Summary.  MyChart is used to connect with patients for Virtual Visits (Telemedicine).  Patients are able to view lab/test results, encounter notes, upcoming appointments, etc.  Non-urgent messages can be sent to your provider as well.   To learn more about what you can do with MyChart, go to [email or web address].    Your next appointment:   6 month(s)  The format for your next appointment:   In Person  Provider:   Christell Constant, MD

## 2021-01-20 LAB — LIPID PANEL
Chol/HDL Ratio: 2.9 ratio (ref 0.0–5.0)
Cholesterol, Total: 105 mg/dL (ref 100–199)
HDL: 36 mg/dL — ABNORMAL LOW (ref >39)
LDL Chol Calc (NIH): 52 mg/dL (ref 0–99)
Triglycerides: 85 mg/dL (ref 0–149)
VLDL Cholesterol Cal: 17 mg/dL (ref 5–40)

## 2021-01-20 LAB — MAGNESIUM: Magnesium: 1.8 mg/dL (ref 1.6–2.3)

## 2021-01-20 LAB — PRO B NATRIURETIC PEPTIDE: NT-Pro BNP: 602 pg/mL — ABNORMAL HIGH (ref 0–376)

## 2021-01-20 LAB — BASIC METABOLIC PANEL
BUN/Creatinine Ratio: 19 (ref 10–24)
BUN: 20 mg/dL (ref 8–27)
CO2: 22 mmol/L (ref 20–29)
Calcium: 9.4 mg/dL (ref 8.6–10.2)
Chloride: 102 mmol/L (ref 96–106)
Creatinine, Ser: 1.04 mg/dL (ref 0.76–1.27)
Glucose: 169 mg/dL — ABNORMAL HIGH (ref 70–99)
Potassium: 4.7 mmol/L (ref 3.5–5.2)
Sodium: 138 mmol/L (ref 134–144)
eGFR: 78 mL/min/{1.73_m2} (ref >59)

## 2021-01-24 ENCOUNTER — Other Ambulatory Visit: Payer: Self-pay | Admitting: Internal Medicine

## 2021-02-15 ENCOUNTER — Ambulatory Visit: Payer: Medicare Other | Admitting: Pharmacist

## 2021-02-15 ENCOUNTER — Other Ambulatory Visit: Payer: Self-pay

## 2021-02-15 VITALS — BP 110/62 | HR 56 | Wt 168.0 lb

## 2021-02-15 DIAGNOSIS — I5020 Unspecified systolic (congestive) heart failure: Secondary | ICD-10-CM

## 2021-02-15 MED ORDER — ATORVASTATIN CALCIUM 40 MG PO TABS: 40.0000 mg | ORAL_TABLET | Freq: Every day | ORAL | 3 refills | Status: DC

## 2021-02-15 MED ORDER — METOPROLOL SUCCINATE ER 25 MG PO TB24: 25.0000 mg | ORAL_TABLET | Freq: Every day | ORAL | 3 refills | Status: DC

## 2021-02-15 MED ORDER — ENTRESTO 24-26 MG PO TABS: 1.0000 | ORAL_TABLET | Freq: Two times a day (BID) | ORAL | 3 refills | Status: DC

## 2021-02-15 MED ORDER — EMPAGLIFLOZIN 10 MG PO TABS
10.0000 mg | ORAL_TABLET | Freq: Every day | ORAL | 3 refills | Status: DC
Start: 2021-02-15 — End: 2021-12-14

## 2021-02-15 MED ORDER — FUROSEMIDE 20 MG PO TABS
20.0000 mg | ORAL_TABLET | Freq: Every day | ORAL | 3 refills | Status: DC
Start: 2021-02-15 — End: 2021-03-09

## 2021-02-15 NOTE — Progress Notes (Signed)
Patient ID: Bryan Melendez                 DOB: 03-Jul-1950                    MRN: [id number]     HPI: Bryan Melendez is a 71 y.o. male patient referred to lipid clinic by Dr Gasper Sells. PMH is significant for CAD s/p likely NSTEMI 06/2019 in [place], found to have HFrEF with EF 20-25% 11/2019 echo, aortic atherosclerosis, T2DM, and HLD. Most recent echo 03/2020 showed EF 25-30%, pt declined ICD. Seen 01/19/21 by MD, pt reported taking both Entresto and losartan at that time (BP 104/58) and losartan was discontinued. Referred to PharmD due to some pt confusion about medications related to CAD and HF. Since then, pt underwent coronary CTA 02/2020 which showed calcium score of 174 (60th percentile for age and gender). This was sent for [facility] analysis which showed hemodynamically significant distal RCA stenosis and low FFR in distal LAD.   Pt presents today with his son who interprets for pt ([place] speaking). He brings in all of his home medications. He did stop losartan after his last visit, continues on Entresto although has been taking doses at lunch and dinner instead of every 12 hours. Other medications match up with our medication list except for aspirin which pt is no longer taking. Reports tolerating medications well. Denies dizziness and balance issues, SOB, and LE edema. Able to complete ADLs. His wife cooks meals at home, pt does not add salt to food. Thinks he has been taking pioglitazone for about 2 months and noticed some weight gain after, wasn't sure if it was from the medication or from eating worse over the holidays. Keeps an eye on BP at home, systolic [id number].  CHF - 2/22 echo: EF 25-30% Meds: Toprol 25mg  daily, Entresto 24-26mg  BID, Jardiance 10mg  daily, furosemide 20mg  daily Labs: 01/19/21: SCr 1.04, Na 138, K 4.7 (when pt on both ARB and ARNI accidentally), NT-pro BNP 602  DM - Meds: metformin-glipizide 500-5mg  2 tabs BID, pioglitazone 30mg  daily, Jardiance 10mg  daily Labs: 11/12/20: A1c  8%  CAD - Meds: atorvastatin 40mg  daily, ASA 81mg  daily Labs: 01/19/21: TC 105, TG 85, HDL 36, LDL 52 LDL goal < 55 given ASCVD + DM  Med intolerances: Entresto - hypotension but tolerating rechallenge now, Farxiga - increased urination and burning with urination   Past Medical History:  Diagnosis Date   Abnormal EKG    DM (diabetes mellitus) ([place])    Dry cough    Food poisoning    Hyperlipidemia    SOB (shortness of breath)    Thrombocytopenia (HCC)    [place]omiting    [place]    Current Outpatient Medications on File Prior to Visit  Medication Sig Dispense Refill   aspirin 81 MG EC tablet Take 1 tablet (81 mg total) by mouth daily. Swallow whole. 30 tablet 12   atorvastatin (LIPITOR) 40 MG tablet Take 1 tablet by mouth daily.     empagliflozin (JARDIANCE) 10 MG TABS tablet Take 1 tablet (10 mg total) by mouth daily before breakfast. 90 tablet 3   ENTRESTO 24-26 MG TAKE 1 TABLET BY MOUTH TWICE A DAY 60 tablet 11   furosemide (LASIX) 20 MG tablet TAKE 1 TABLET BY MOUTH EVERY DAY 90 tablet 2   glipiZIDE-metformin (METAGLIP) 5-500 MG tablet Take 2 tablets by mouth 2 (two) times daily.     metoprolol succinate (TOPROL-XL) 25 MG 24 hr tablet TAKE  1 TABLET BY MOUTH EVERY DAY 90 tablet 1   pioglitazone (ACTOS) 30 MG tablet Take 30 mg by mouth daily.     No current facility-administered medications on file prior to visit.    No Known Allergies  Assessment/Plan:  1. Medication review - reviewed all medications and indications with pt and his son. Emphasized need to continue taking his atorvastatin 40mg  daily which he asks about, LDL is at goal < 55 on therapy. Advised him to resume aspirin 81mg  daily which he had not been taking.  2. HFrEF - Pt taking Entresto 24-26mg  BID, Toprol 25mg  daily, Jardiance 10mg  daily, and furosemide 20mg  daily. Systolic 0000000 today and HR in the 50s, unable to titrate HF meds. Of note, pt is taking pioglitazone for his DM which is contraindicated in  patients with HF - has Black Box Warning for increased risk of CHF exacerbations. Will discontinue this. May need closer follow up with PCP as A1c is 8 and pioglitazone is being stopped - faxing note to PCP.  All cardiac meds refilled. Called his pharmacy to have them permanently discontinue pioglitazone and losartan from his med list so they do not get accidentally refilled in the future.  Sidonia Nutter E. Montell Leopard, PharmD, BCACP, [place] [id number], [place],  [address] Phone: [phone]; Fax: [phone] 02/15/2021 9:40 AM

## 2021-02-15 NOTE — Patient Instructions (Addendum)
Diabetes: Continue taking Jardiance and metformin-glipizide Stop taking pioglitazone - this can make your heart failure worse  Heart failure: Continue taking metoprolol once a day Continue taking Entresto 1 tablet twice a day. Space these doses apart by 12 hours These 2 medications can lower your blood pressure a bit Your Jardiance also helps with your heart failure  Heart disease: Continue taking atorvastatin once a day Restart aspirin 81mg  - 1 tablet daily

## 2021-03-09 ENCOUNTER — Telehealth: Payer: Self-pay | Admitting: Internal Medicine

## 2021-03-09 NOTE — Telephone Encounter (Signed)
°  Pt c/o medication issue:  1. Name of Medication: empagliflozin (JARDIANCE) 10 MG TABS tablet  2. How are you currently taking this medication (dosage and times per day)? Take 1 tablet (10 mg total) by mouth daily before breakfast.  3. Are you having a reaction (difficulty breathing--STAT)?   4. What is your medication issue? Pt's son said, pt is not reacting good with this meds, pt is going to the bathroom a lot, the pt requesting if he can go back to his old medication pioglitazone

## 2021-03-09 NOTE — Telephone Encounter (Signed)
Called pt son advised of MD recommendation to stop furosemide.  This should help with pt c/o increased urination.  Pt should continue on jardiance as medication improves heart function.  Advised son that Pioglitazone is not recommended for pt with HF.  Son will have son stop lasix and call back after 1 week if pt continues with symptoms.  Pt denies having difficulty urinating.

## 2021-05-25 IMAGING — CT CT HEART MORP W/ CTA COR W/ SCORE W/ CA W/CM &/OR W/O CM
4 of 7 series · 8 of 20 positions shown, 9 images · IV contrast (APPLIED)
Comparison: None.
COMPARISON: None.

Addendum:
EXAM:
OVER-READ INTERPRETATION  CT CHEST

The following report is an over-read performed by radiologist Dr.
Haritha Boland [REDACTED] on 02/18/2020. This
over-read does not include interpretation of cardiac or coronary
anatomy or pathology. The coronary calcium score/coronary CTA
interpretation by the cardiologist is attached.
CLINICAL DATA: Chest pain

[Series 6: best diast 70 % · axial · 0.37mm/px · z∈[+1171,+1216]mm · 2 of 333 slices shown]
[im 111/333  vessel]
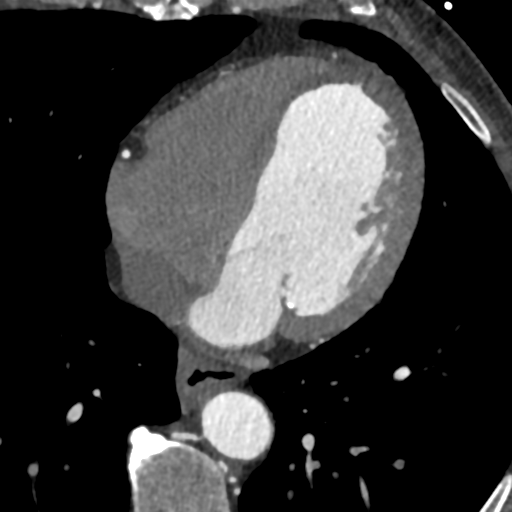
[im 222/333  vessel]
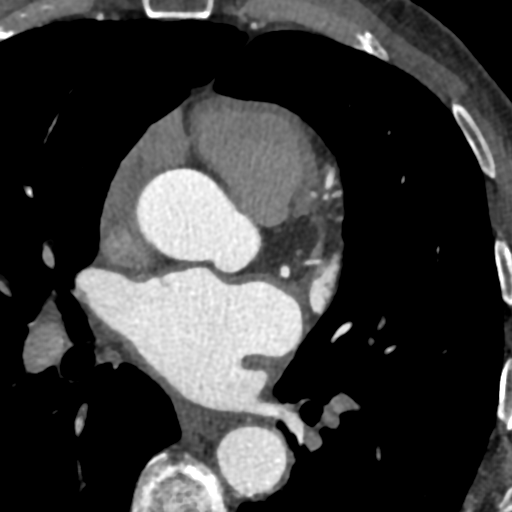

[Series 7: best syst · axial · 0.37mm/px · z∈[+1171,+1216]mm · 2 of 333 slices shown, 3 images]
[im 111/333  vessel]
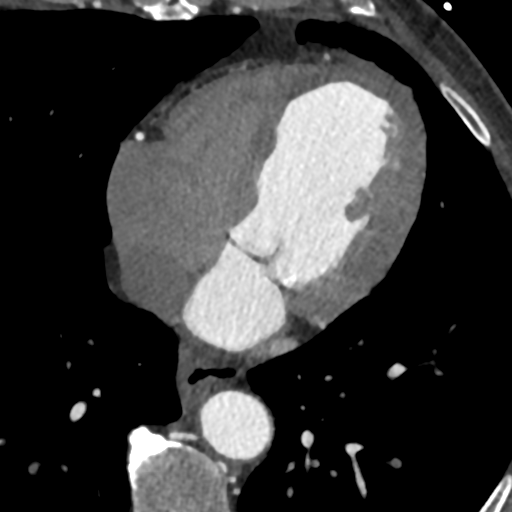
[im 111/333  lung]
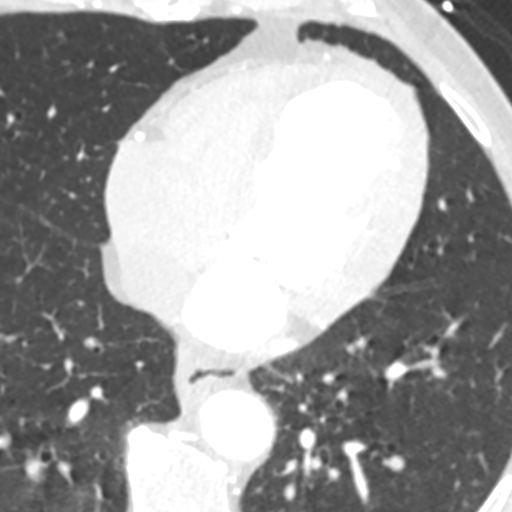
[im 222/333  vessel]
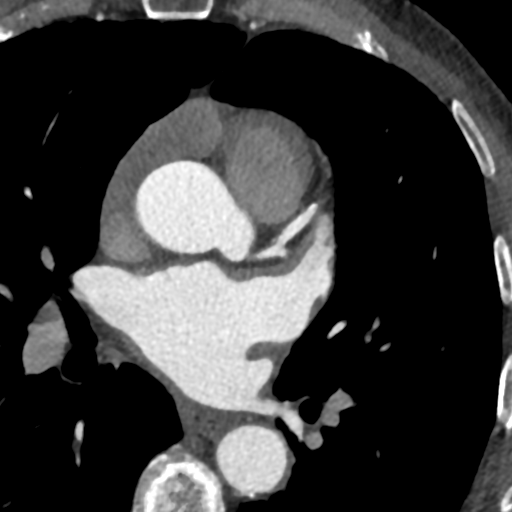

[Series 8: ts diast sharp 70 % · axial · 0.37mm/px · z∈[+1171,+1216]mm · 2 of 333 slices shown]
[im 111/333  lung]
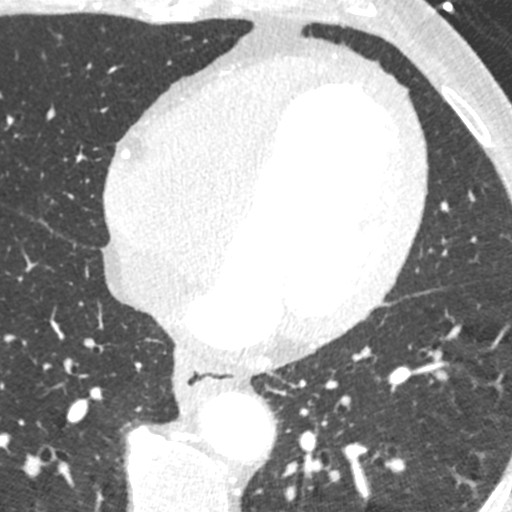
[im 222/333  lung]
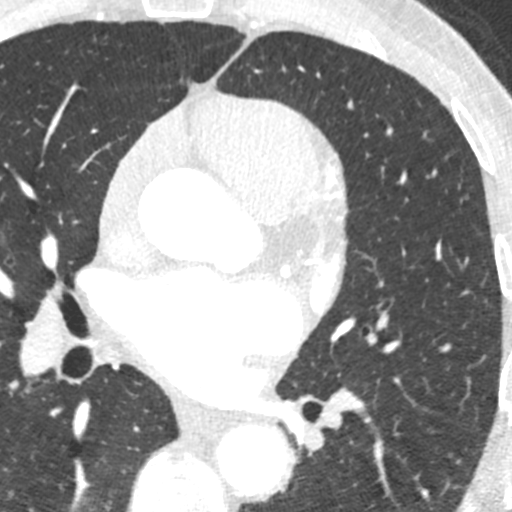

[Series 9: ts syst sharp · axial · 0.37mm/px · z∈[+1171,+1216]mm · 2 of 333 slices shown]
[im 111/333  lung]
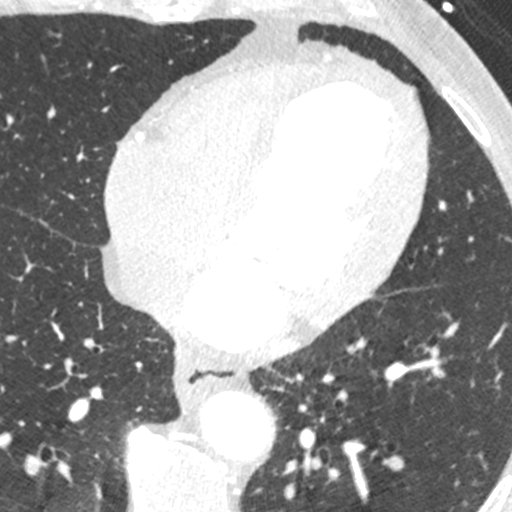
[im 222/333  lung]
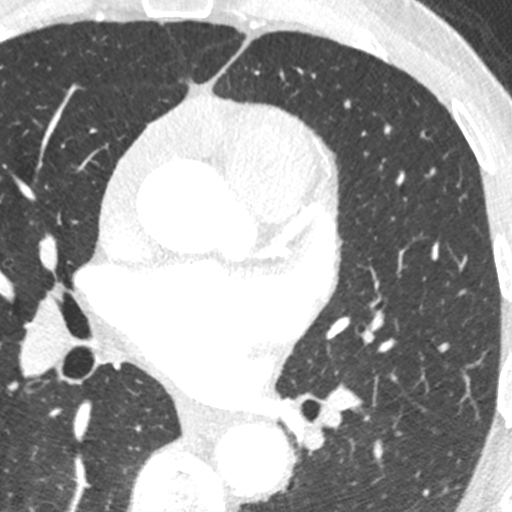

[8 of 20 positions shown; findings below may reference images not displayed]

FINDINGS: Aortic atherosclerosis. Small 5 mm pulmonary nodules in the superior
segment of the right lower lobe (axial images 7 and 14 of series
12). Small calcified granuloma in the left lower lobe. Within the
visualized portions of the thorax there are no other larger more
suspicious appearing pulmonary nodules or masses, there is no acute
consolidative airspace disease, no pleural effusions, no
pneumothorax and no lymphadenopathy. Visualized portions of the
upper abdomen are unremarkable. There are no aggressive appearing
lytic or blastic lesions noted in the visualized portions of the
skeleton.
IMPRESSION: 1. Small right lower lobe pulmonary nodules measuring 5 mm,
nonspecific, but statistically likely benign. No follow-up needed if
patient is low-risk (and has no known or suspected primary
neoplasm). Non-contrast chest CT can be considered in 12 months if
patient is high-risk. This recommendation follows the consensus
statement: Guidelines for Management of Incidental Pulmonary Nodules
Detected on CT Images: From the [HOSPITAL] 2654; Radiology
2.  Aortic Atherosclerosis (2NQK4-4N8.8).
EXAM:
Cardiac CTA

MEDICATIONS:
Sub lingual nitro. 4mg x 2

:
The patient was scanned on a Siemens [REDACTED]ice scanner. Gantry
rotation speed was 250 msecs. Collimation was 0.6 mm. A 100 kV
prospective scan was triggered in the ascending thoracic aorta at
35-75% of the R-R interval. Average HR during the scan was 60 bpm.
The 3D data set was interpreted on a dedicated work station using
MPR, MIP and VRT modes. A total of 80cc of contrast was used.
FINDINGS: Non-cardiac: See separate report from [REDACTED].

The pulmonary veins drain normally to the left atrium. No
significant LA appendage thrombus.

Calcium Score: 174 Agatston units.

Coronary Arteries: Right dominant with no anomalies

LM: No plaque or stenosis.

LAD system: Mixed plaque ostial LAD, mild (<50%) stenosis. Mixed
plaque proximal and mid LAD, probably no more than mild (<50%)
stenosis. Moderate-sized D1 with calcified plaque ostially and mixed
plaque proximally. Possible severe (70-90%) stenosis.

Circumflex system: Mixed plaque proximally, mild (<50%) stenosis.

RCA system: Mixed plaque proximal RCA, mild (<50%) stenosis. Mixed
plaque distal RCA, possibly severe (70-90%) stenosis.
IMPRESSION: 1. Coronary artery calcium score 174 Agatston units. This places the
patient in the 60th percentile for age and gender, suggesting
intermediate risk for future cardiac events.

2.  Possible severe distal RCA stenosis.

3.  Possible severe stenosis in a moderate-sized D1.

Will send for FFR.

Es Tiger

*** End of Addendum ***
EXAM:
OVER-READ INTERPRETATION  CT CHEST

The following report is an over-read performed by radiologist Dr.
Haritha Boland [REDACTED] on 02/18/2020. This
over-read does not include interpretation of cardiac or coronary
anatomy or pathology. The coronary calcium score/coronary CTA
interpretation by the cardiologist is attached.
FINDINGS: Aortic atherosclerosis. Small 5 mm pulmonary nodules in the superior
segment of the right lower lobe (axial images 7 and 14 of series
12). Small calcified granuloma in the left lower lobe. Within the
visualized portions of the thorax there are no other larger more
suspicious appearing pulmonary nodules or masses, there is no acute
consolidative airspace disease, no pleural effusions, no
pneumothorax and no lymphadenopathy. Visualized portions of the
upper abdomen are unremarkable. There are no aggressive appearing
lytic or blastic lesions noted in the visualized portions of the
skeleton.
IMPRESSION: 1. Small right lower lobe pulmonary nodules measuring 5 mm,
nonspecific, but statistically likely benign. No follow-up needed if
patient is low-risk (and has no known or suspected primary
neoplasm). Non-contrast chest CT can be considered in 12 months if
patient is high-risk. This recommendation follows the consensus
statement: Guidelines for Management of Incidental Pulmonary Nodules
Detected on CT Images: From the [HOSPITAL] 2654; Radiology
2.  Aortic Atherosclerosis (2NQK4-4N8.8).

## 2021-05-26 DIAGNOSIS — E782 Mixed hyperlipidemia: Secondary | ICD-10-CM | POA: Diagnosis not present

## 2021-05-26 DIAGNOSIS — E1169 Type 2 diabetes mellitus with other specified complication: Secondary | ICD-10-CM | POA: Diagnosis not present

## 2021-05-26 DIAGNOSIS — R809 Proteinuria, unspecified: Secondary | ICD-10-CM | POA: Diagnosis not present

## 2021-07-13 ENCOUNTER — Encounter: Payer: Self-pay | Admitting: Internal Medicine

## 2021-07-13 ENCOUNTER — Ambulatory Visit: Payer: Medicare Other | Admitting: Internal Medicine

## 2021-07-13 VITALS — BP 108/60 | HR 59 | Ht 64.0 in | Wt 161.0 lb

## 2021-07-13 DIAGNOSIS — I700 Atherosclerosis of aorta: Secondary | ICD-10-CM

## 2021-07-13 DIAGNOSIS — E1169 Type 2 diabetes mellitus with other specified complication: Secondary | ICD-10-CM

## 2021-07-13 DIAGNOSIS — I25118 Atherosclerotic heart disease of native coronary artery with other forms of angina pectoris: Secondary | ICD-10-CM | POA: Diagnosis not present

## 2021-07-13 DIAGNOSIS — E785 Hyperlipidemia, unspecified: Secondary | ICD-10-CM

## 2021-07-13 DIAGNOSIS — I10 Essential (primary) hypertension: Secondary | ICD-10-CM

## 2021-07-13 DIAGNOSIS — E119 Type 2 diabetes mellitus without complications: Secondary | ICD-10-CM | POA: Diagnosis not present

## 2021-07-13 DIAGNOSIS — I5020 Unspecified systolic (congestive) heart failure: Secondary | ICD-10-CM | POA: Diagnosis not present

## 2021-07-13 MED ORDER — NITROGLYCERIN 0.4 MG SL SUBL: 0.4000 mg | SUBLINGUAL_TABLET | SUBLINGUAL | 3 refills | Status: AC | PRN

## 2021-07-13 NOTE — Patient Instructions (Signed)
Medication Instructions:  Nitro 0.4 mg as needed for chest pain  *If you need a refill on your cardiac medications before your next appointment, please call your pharmacy*   Lab Work:  If you have labs (blood work) drawn today and your tests are completely normal, you will receive your results only by: [place] (if you have MyChart) OR A paper copy in the mail If you have any lab test that is abnormal or we need to change your treatment, we will call you to review the results.   Testing/Procedures:    Follow-Up: At [facility], you and your health needs are our priority.  As part of our continuing mission to provide you with exceptional heart care, we have created designated Provider Care Teams.  These Care Teams include your primary Cardiologist (physician) and Advanced Practice Providers (APPs -  Physician Assistants and Nurse Practitioners) who all work together to provide you with the care you need, when you need it.  We recommend signing up for the patient portal called "MyChart".  Sign up information is provided on this After Visit Summary.  MyChart is used to connect with patients for Virtual Visits (Telemedicine).  Patients are able to view lab/test results, encounter notes, upcoming appointments, etc.  Non-urgent messages can be sent to your provider as well.   To learn more about what you can do with MyChart, go to [email or web address].    Your next appointment:   4 month(s)  The format for your next appointment:   In Person  Provider:   Werner Lean, MD     Other Instructions   Important Information About Sugar

## 2021-07-13 NOTE — Progress Notes (Signed)
Cardiology Office Note:    Date:  07/13/2021   ID:  Bryan Melendez, DOB 1950-05-06, MRN [id number]  PCP:  Kristen Loader, FNP  [facility] Cardiologist:  Werner Lean, MD  [place] Electrophysiologist:  None   Referring MD: Kristen Loader, FNP  CC: Follow up HF  Patient deferred interpretor in lieu of his son (often makes this request) - [place] Interpretor number [phone] for when he prefers interpretor  History of Present Illness:    Emrick Spires is a 71 y.o. male with a hx of Coronary Artery Disease, Aortic Atherosclerosis & HLD &  T2DM who had a likely NSTEMI 06/2019 in [place], seen in consultation 10/20/19.  Through our care, he has refused LHC multiple times; we will not re-engage unless he has new symptoms.  2021: Found to have HFrEF (20-25%) 11/12/19 with LAD WMAs and NT-proBNP of 2433.  On GDMT (lasix 20 mg, losartan 25 mg-> Entresto, metoprolol succinate 25 mg), NT-proBNP improved to 837, MR was ~35% with evidence of apical transmural scar. CCTA showed LAD disease and distal RCA disease.    2022: EF~ 30%.  Was unable to tolerate Entresto due to hypotensions.  Went back to [place].  Increased urinate and stopped lasix.    Patient notes that he is doing well.   Since last visit notes that he went back to [place] with no issues . There are no interval hospital/ED visit.   His wife has had surgery recently and its been a bit stressful.  Patient notes 5 minutes of chest tightness of Sunday.  Went to the arm.  No further events  No radiation to arm or jaw.  No change in exercise tolerance; still very active with no sx.  No SOB/DOE and no PND/Orthopnea.  No weight gain or leg swelling.  No palpitations or syncope.  No issues with lasix.  Past Medical History:  Diagnosis Date   Abnormal EKG    DM (diabetes mellitus) (HCC)    Dry cough    Food poisoning    Hyperlipidemia    SOB (shortness of breath)    Thrombocytopenia (HCC)    Vomiting    [place]     Past Surgical History:  Procedure Laterality Date   NO PAST SURGERIES      Current Medications: Current Meds  Medication Sig   aspirin 81 MG EC tablet Take 1 tablet (81 mg total) by mouth daily. Swallow whole.   atorvastatin (LIPITOR) 40 MG tablet Take 1 tablet (40 mg total) by mouth daily.   empagliflozin (JARDIANCE) 10 MG TABS tablet Take 1 tablet (10 mg total) by mouth daily before breakfast.   glipiZIDE-metformin (METAGLIP) 5-500 MG tablet Take 2 tablets by mouth 2 (two) times daily.   metoprolol succinate (TOPROL-XL) 25 MG 24 hr tablet Take 1 tablet (25 mg total) by mouth daily.   nitroGLYCERIN (NITROSTAT) 0.4 MG SL tablet Place 1 tablet (0.4 mg total) under the tongue every 5 (five) minutes as needed for chest pain.   sacubitril-valsartan (ENTRESTO) 24-26 MG Take 1 tablet by mouth every 12 (twelve) hours.    Allergies:   Empagliflozin   Social History   Socioeconomic History   Marital status: Married    Spouse name: Not on file   Number of children: Not on file   Years of education: Not on file   Highest education level: Not on file  Occupational History   Not on file  Tobacco Use   Smoking status: Never  Smokeless tobacco: Never  Vaping Use   Vaping Use: Every day  Substance and Sexual Activity   Alcohol use: Not on file   Drug use: Not on file   Sexual activity: Not on file  Other Topics Concern   Not on file  Social History Narrative   Not on file   Social Determinants of Health   Financial Resource Strain: Not on file  Food Insecurity: Not on file  Transportation Needs: Not on file  Physical Activity: Not on file  Stress: Not on file  Social Connections: Not on file   Family History: The patient's family history includes Cancer - Prostate in his father; Healthy in his daughter, daughter, daughter, son, and son.  ROS:   Please see the history of present illness.    All other systems reviewed and are negative.  EKGs/Labs/Other Studies Reviewed:     The following studies were reviewed today:  EKG:  01/18/21: Sinus bradycardia rate 56 low voltage and anterior infract 12/25/19: Sinus rhythm, rate 70, anterior Q waves with deep anterior and anterolateral TWI.  Transthoracic Echocardiogram: Date: 03/29/20 Results:  1. LVEF remains severely reduced ~25-30%. The mid septum and apical  segments are akineitic consistent with LAD infarction. No LV thrombus. EF  roughly the same. Left ventricular ejection fraction, by estimation, is 25  to 30%. The left ventricle has  severely decreased function. The left ventricle demonstrates regional wall  motion abnormalities (see scoring diagram/findings for description). The  left ventricular internal cavity size was mildly to moderately dilated.  Left ventricular diastolic  parameters are consistent with Grade I diastolic dysfunction (impaired  relaxation).   2. Right ventricular systolic function is normal. The right ventricular  size is normal. There is normal pulmonary artery systolic pressure. The  estimated right ventricular systolic pressure is 34.4 mmHg.   3. The mitral valve is grossly normal. Trivial mitral valve  regurgitation. No evidence of mitral stenosis.   4. The aortic valve is tricuspid. Aortic valve regurgitation is not  visualized. No aortic stenosis is present.   5. The inferior vena cava is dilated in size with >50% respiratory  variability, suggesting right atrial pressure of 8 mmHg.   CardiacCT: Date: 02/18/2020 Results: IMPRESSION: 1. Coronary artery calcium score 174 Agatston units. This places the patient in the 60th percentile for age and gender, suggesting intermediate risk for future cardiac events.  2.  Possible severe distal RCA stenosis.  3.  Possible severe stenosis in a moderate-sized D1. 4. Aortic Atherosclerosis  Small right lower lobe pulmonary nodules measuring 5 mm, nonspecific, but statistically likely benign. No follow-up needed if patient is  low-risk (and has no known or suspected primary neoplasm). Non-contrast chest CT can be considered in 12 months if patient is high-risk. This recommendation follows the consensus statement: Guidelines for Management of Incidental Pulmonary Nodules Detected on CT Images: From the Fleischner Society 2017; Radiology 2017; 284:228-243.  Cardiac MRI: Date: 12/10/2019 Results: 1. Moderate to severely dilated left ventricle, thin myocardium, and systolic function is moderately reduced (LVEF =36%). There is mid anterior, anteroseptal, and inferoseptal hypokinesis, as well as apical hypokinesis (all territories). No LV thrombus is noted. There is transmural late gadolinium enhancement: Inferoseptal mid and apex, anteroseptal mid and apex, anterior mid and apex, anterolateral apex, and true apex. These segments are likely nonviable.   LVEDD: 63 mm  LVESD: 41 mm  LVEDVi:132 mL/m2  SVi:48 mL/m2  CI: 3.08 L/min/m2  Myocardial mass/BSA:77 g/m2   2. Normal right ventricular size,  thickness and systolic function (RVEF 99991111). There are no regional wall motion abnormalities.   3.  Normal left and right atrial size.  LVESV 70 mL  RVESV 26 mL   4. Normal size of the aortic root, ascending aorta and pulmonary artery.   5.  No significant valvular abnormalities.   6.  Normal pericardium.  No pericardial effusion.   IMPRESSION: Moderate to severely dilated left ventricle, thin myocardium, and systolic function is moderately reduced (LVEF =36%). There is mid anterior, anteroseptal, and inferoseptal hypokinesis, as well as apical hypokinesis (all territories). No LV thrombus is noted. There is transmural late gadolinium enhancement: Inferoseptal mid and apex, anteroseptal mid and apex, anterior mid and apex, anterolateral apex, and true apex. These segments are likely nonviable.   Recent Labs: 01/19/2021: BUN 20; Creatinine, Ser 1.04; Magnesium 1.8; NT-Pro BNP 602; Potassium 4.7; Sodium 138    Physical Exam:    VS:  BP 108/60   Pulse (!) 59   Ht 5\' 4"  (1.626 m)   Wt 161 lb (73 kg)   SpO2 96%   BMI 27.64 kg/m     Wt Readings from Last 3 Encounters:  07/13/21 161 lb (73 kg)  02/15/21 168 lb (76.2 kg)  01/19/21 165 lb (74.8 kg)    Gen: no distress   Neck: No JVD Ears:  Bilateral Pilar Plate Sign Cardiac: No Rubs or Gallops, no Murmur, regular rhythm, +2 radial pulses Respiratory: Clear to auscultation bilaterally, normal effort, normal  respiratory rate GI: Soft, nontender, non-distended  MS: trace edema;  moves all extremities Integument: Skin feels warm Neuro:  At time of evaluation, alert and oriented to person/place/time/situation  Psych: Normal affect, patient feels    ASSESSMENT:    1. Coronary artery disease of native artery of native heart with stable angina pectoris ([place])   2. Diabetes mellitus with coincident hypertension ([place])   3. Hyperlipidemia associated with type 2 diabetes mellitus ([place])   4. Aortic atherosclerosis (HCC)   5. HFrEF (heart failure with reduced ejection fraction) (HCC)     PLAN:    Coronary Artery Disease; Moderate disease HLD with DM Aortic atherosclerosis - potentially symptomatic - anatomy: D1 disease, distal LAD disease, distal RCA disease - continue ASA 81 mg -  atorvastatin 40 , goal LDL < 55 - continue metoprolol succinate 25 - I have been concerned that this is related to his CAD, due to his BP I am not sure if we have a lot of room for medical management.  We have discussed that we have reviewed his prior CCTA.   - will prescribe PRN nitro; if no further events will take conservative approach; if further events they will call and we will start ranexa 500 mg PO BID and will see urgent for LHC plan - if unstable angina will go to ED - otherwise will see in October/November   Heart Failure with reduced ejection fraction - NYHA class I, Stage B, euvolemic, likely coronary mediated - Will  give lasix 20 mg PO as PRN -  Jardiance 10 or equivalent SGLT2i  - metoprolol succinate 25 mg PO Daily - Discussed daily weights, and fluid restriction of < 2 L  - Hypotension with higher Entresto (on 24/26) - Please see prior notes in regard to his deferral of EP eval for ICD discussions (has deferred)  October/November with me  Medication Adjustments/Labs and Tests Ordered: Current medicines are reviewed at length with the patient today.  Concerns regarding medicines are outlined above.  No orders  of the defined types were placed in this encounter.   Meds ordered this encounter  Medications   nitroGLYCERIN (NITROSTAT) 0.4 MG SL tablet    Sig: Place 1 tablet (0.4 mg total) under the tongue every 5 (five) minutes as needed for chest pain.    Dispense:  25 tablet    Refill:  3     Patient Instructions  Medication Instructions:  Nitro 0.4 mg as needed for chest pain  *If you need a refill on your cardiac medications before your next appointment, please call your pharmacy*   Lab Work:  If you have labs (blood work) drawn today and your tests are completely normal, you will receive your results only by: [place] (if you have MyChart) OR A paper copy in the mail If you have any lab test that is abnormal or we need to change your treatment, we will call you to review the results.   Testing/Procedures:    Follow-Up: At [facility], you and your health needs are our priority.  As part of our continuing mission to provide you with exceptional heart care, we have created designated Provider Care Teams.  These Care Teams include your primary Cardiologist (physician) and Advanced Practice Providers (APPs -  Physician Assistants and Nurse Practitioners) who all work together to provide you with the care you need, when you need it.  We recommend signing up for the patient portal called "MyChart".  Sign up information is provided on this After Visit Summary.  MyChart is used to connect with patients for  Virtual Visits (Telemedicine).  Patients are able to view lab/test results, encounter notes, upcoming appointments, etc.  Non-urgent messages can be sent to your provider as well.   To learn more about what you can do with MyChart, go to [email or web address].    Your next appointment:   4 month(s)  The format for your next appointment:   In Person  Provider:   Werner Lean, MD     Other Instructions   Important Information About Sugar         Signed, Werner Lean, MD  07/13/2021 9:43 AM    [place]

## 2021-10-11 ENCOUNTER — Encounter (HOSPITAL_COMMUNITY): Payer: Self-pay | Admitting: *Deleted

## 2021-10-11 ENCOUNTER — Ambulatory Visit (HOSPITAL_COMMUNITY)
Admission: EM | Admit: 2021-10-11 | Discharge: 2021-10-11 | Disposition: A | Discharge status: Home / Self Care | Payer: Medicare Other | Attending: Emergency Medicine | Admitting: Emergency Medicine

## 2021-10-11 DIAGNOSIS — E11628 Type 2 diabetes mellitus with other skin complications: Secondary | ICD-10-CM

## 2021-10-11 DIAGNOSIS — L237 Allergic contact dermatitis due to plants, except food: Secondary | ICD-10-CM

## 2021-10-11 DIAGNOSIS — E119 Type 2 diabetes mellitus without complications: Secondary | ICD-10-CM

## 2021-10-11 DIAGNOSIS — E1165 Type 2 diabetes mellitus with hyperglycemia: Secondary | ICD-10-CM

## 2021-10-11 LAB — CBG MONITORING, ED: Glucose-Capillary: 149 mg/dL — ABNORMAL HIGH (ref 70–99)

## 2021-10-11 MED ORDER — PREDNISONE 10 MG (21) PO TBPK: ORAL_TABLET | Freq: Every day | ORAL | 0 refills | Status: DC

## 2021-10-11 NOTE — ED Triage Notes (Signed)
Pt states her has rash on whole body x 4-5 days he is use ing OTC anti itch creams and sprays without relief.

## 2021-10-11 NOTE — Discharge Instructions (Addendum)
Please take the medication as prescribed.  This medication can raise your blood sugars.  I recommend purchasing a glucometer and test strips, you can get these at [facility] for around $20.  Please monitor your blood sugars at least daily while taking the medicine. Additionally, try to eat low carbohydrate meals and drink lots of fluids.  Follow up with your primary care provider.

## 2021-10-11 NOTE — ED Provider Notes (Signed)
[facility]    CSN: [id number] Arrival date & time: 10/11/21  1624     History   Chief Complaint Chief Complaint  Patient presents with   Rash    HPI Bryan Melendez is a 71 y.o. male.  Presents with rash x5 days. Reports he was doing some gardening and yard work before the rash began. Notes bilateral arms, chest, abdomen, bilateral legs have rash.  It has become itchy, increasing pruritus over the last few days.  He has been trying topical hydrocortisone and poison oak spray without relief.  He is type II diabetic, does not have glucometer or check his sugars at home  Past Medical History:  Diagnosis Date   Abnormal EKG    DM (diabetes mellitus) (HCC)    Dry cough    Food poisoning    Hyperlipidemia    SOB (shortness of breath)    Thrombocytopenia (HCC)    Vomiting    [place]    Patient Active Problem List   Diagnosis Date Noted   Coronary artery disease of native artery of native heart with stable angina pectoris (HCC) 03/09/2020   Aortic atherosclerosis (HCC) 03/09/2020   HFrEF (heart failure with reduced ejection fraction) (HCC) 11/14/2019   Type 2 diabetes mellitus with hyperlipidemia (HCC) 10/20/2019   Hyperlipidemia associated with type 2 diabetes mellitus (HCC) 10/20/2019    Past Surgical History:  Procedure Laterality Date   NO PAST SURGERIES         Home Medications    Prior to Admission medications   Medication Sig Start Date End Date Taking? Authorizing Provider  aspirin 81 MG EC tablet Take 1 tablet (81 mg total) by mouth daily. Swallow whole. 11/14/19  Yes Chandrasekhar, Mahesh A, MD  atorvastatin (LIPITOR) 40 MG tablet Take 1 tablet (40 mg total) by mouth daily. 02/15/21  Yes Chandrasekhar, Mahesh A, MD  empagliflozin (JARDIANCE) 10 MG TABS tablet Take 1 tablet (10 mg total) by mouth daily before breakfast. 02/15/21  Yes Chandrasekhar, Mahesh A, MD  glipiZIDE-metformin (METAGLIP) 5-500 MG tablet Take 2 tablets by mouth 2 (two) times  daily. 05/06/20  Yes [provider]  metoprolol succinate (TOPROL-XL) 25 MG 24 hr tablet Take 1 tablet (25 mg total) by mouth daily. 02/15/21  Yes Chandrasekhar, Mahesh A, MD  nitroGLYCERIN (NITROSTAT) 0.4 MG SL tablet Place 1 tablet (0.4 mg total) under the tongue every 5 (five) minutes as needed for chest pain. 07/13/21  Yes Chandrasekhar, Mahesh A, MD  predniSONE (STERAPRED UNI-PAK 21 TAB) 10 MG (21) TBPK tablet Take by mouth daily. Take 6 tabs by mouth daily  for 2 days, then 5 tabs for 2 days, then 4 tabs for 2 days, then 3 tabs for 2 days, 2 tabs for 2 days, then 1 tab by mouth daily for 2 days 10/11/21  Yes Johnmichael Melhorn, Lurena Joiner, PA-C  sacubitril-valsartan (ENTRESTO) 24-26 MG Take 1 tablet by mouth every 12 (twelve) hours. 02/15/21  Yes Christell Constant, MD    Family History Family History  Problem Relation Age of Onset   Cancer - Prostate Father    Healthy Son    Healthy Son    Healthy Daughter    Healthy Daughter    Healthy Daughter     Social History Social History   Tobacco Use   Smoking status: Never   Smokeless tobacco: Never  Vaping Use   Vaping Use: Every day  Substance Use Topics   Alcohol use: Not Currently   Drug use: Never  Allergies   Patient has no active allergies.   Review of Systems Review of Systems Per HPI  Physical Exam Triage Vital Signs ED Triage Vitals  Enc Vitals Group     BP 10/11/21 1655 123/69     Pulse Rate 10/11/21 1655 61     Resp 10/11/21 1655 18     Temp 10/11/21 1655 (!) 97.5 F (36.4 C)     Temp Source 10/11/21 1655 Oral     SpO2 10/11/21 1655 96 %     Weight --      Height --      Head Circumference --      Peak Flow --      Pain Score 10/11/21 1654 0     Pain Loc --      Pain Edu? --      Excl. in GC? --    No data found.  Updated Vital Signs BP 123/69 (BP Location: Left Arm)   Pulse 61   Temp (!) 97.5 F (36.4 C) (Oral)   Resp 18   SpO2 96%   Physical Exam Vitals and nursing note reviewed.   Constitutional:      General: He is not in acute distress.    Appearance: Normal appearance.  HENT:     Mouth/Throat:     Mouth: Mucous membranes are moist.     Pharynx: Oropharynx is clear.  Cardiovascular:     Rate and Rhythm: Normal rate and regular rhythm.     Pulses: Normal pulses.     Heart sounds: Normal heart sounds.  Pulmonary:     Effort: Pulmonary effort is normal. No respiratory distress.     Breath sounds: Normal breath sounds.  Abdominal:     Tenderness: There is no abdominal tenderness.  Skin:    Findings: Rash present.     Comments: Maculopapular rash in linear distributions across bilateral arms, chest, abdomen and torso, lower legs.  Face is spared  Neurological:     Mental Status: He is alert and oriented to person, place, and time.      UC Treatments / Results  Labs (all labs ordered are listed, but only abnormal results are displayed) Labs Reviewed  CBG MONITORING, ED - Abnormal; Notable for the following components:      Result Value   Glucose-Capillary 149 (*)    All other components within normal limits    EKG   Radiology No results found.  Procedures Procedures   Medications Ordered in UC Medications - No data to display  Initial Impression / Assessment and Plan / UC Course  I have reviewed the triage vital signs and the nursing notes.  Pertinent labs & imaging results that were available during my care of the patient were reviewed by me and considered in my medical decision making (see chart for details).  Rash consistent with poison ivy.  Due to extensive coverage of body, patient likely needs prednisone taper.  He does not check his blood sugars at home and does not have a glucometer. Blood glucose in clinic today is 149.  I recommend patient get a glucometer and check sugars at least daily while taking the prednisone.  Additionally low-carb diet with increased fluid. Patient verbalizes understanding. Return precautions discussed.  Patient agrees to plan   Final Clinical Impressions(s) / UC Diagnoses   Final diagnoses:  Poison ivy  Type 2 diabetes mellitus without complication, without long-term current use of insulin [facility])     Discharge Instructions  Please take the medication as prescribed.  This medication can raise your blood sugars.  I recommend purchasing a glucometer and test strips, you can get these at [facility] for around $20.  Please monitor your blood sugars at least daily while taking the medicine. Additionally, try to eat low carbohydrate meals and drink lots of fluids.  Follow up with your primary care provider.    ED Prescriptions     Medication Sig Dispense Auth. Provider   predniSONE (STERAPRED UNI-PAK 21 TAB) 10 MG (21) TBPK tablet Take by mouth daily. Take 6 tabs by mouth daily  for 2 days, then 5 tabs for 2 days, then 4 tabs for 2 days, then 3 tabs for 2 days, 2 tabs for 2 days, then 1 tab by mouth daily for 2 days 42 tablet Rylah Fukuda, Wells Guiles, PA-C      PDMP not reviewed this encounter.   Adler Chartrand, Wells Guiles, [place] 10/11/21 [id number]

## 2021-11-16 DIAGNOSIS — E782 Mixed hyperlipidemia: Secondary | ICD-10-CM | POA: Diagnosis not present

## 2021-11-16 DIAGNOSIS — I5020 Unspecified systolic (congestive) heart failure: Secondary | ICD-10-CM | POA: Diagnosis not present

## 2021-11-16 DIAGNOSIS — D696 Thrombocytopenia, unspecified: Secondary | ICD-10-CM | POA: Diagnosis not present

## 2021-11-16 DIAGNOSIS — R809 Proteinuria, unspecified: Secondary | ICD-10-CM | POA: Diagnosis not present

## 2021-11-16 DIAGNOSIS — Z23 Encounter for immunization: Secondary | ICD-10-CM | POA: Diagnosis not present

## 2021-11-16 DIAGNOSIS — E1169 Type 2 diabetes mellitus with other specified complication: Secondary | ICD-10-CM | POA: Diagnosis not present

## 2021-11-16 DIAGNOSIS — Z0000 Encounter for general adult medical examination without abnormal findings: Secondary | ICD-10-CM | POA: Diagnosis not present

## 2021-12-12 NOTE — Progress Notes (Unsigned)
Cardiology Office Note:    Date:  12/14/2021   ID:  Bryan Melendez, DOB 05/25/1950, MRN [id number]  PCP:  Kristen Loader, FNP  [facility] Cardiologist:  Werner Lean, MD  [facility] Electrophysiologist:  None   Referring MD: Kristen Loader, FNP   CC: Follow up CP  Patient deferred interpretor in lieu of his son (almost always makes this request) - [place] Interpretor number [phone] for when he prefers interpretor  History of Present Illness:    Bryan Melendez is a 71 y.o. male with a hx of Coronary Artery Disease, Aortic Atherosclerosis & HLD &  T2DM who had a likely NSTEMI 06/2019 in [place], seen in consultation 10/20/19.  Through our care, he has refused LHC multiple times; we will not re-engage unless he has new symptoms.  Similarly had deferred EP eval for ICD.  2021: Found to have HFrEF (20-25%) 11/12/19 with LAD WMAs and NT-proBNP of 2433.  On GDMT (lasix 20 mg, losartan 25 mg-> Entresto, metoprolol succinate 25 mg), NT-proBNP improved to 837, MR was ~35% with evidence of apical transmural scar. CCTA showed LAD disease and distal RCA disease.    2022: EF~ 30%.  Was unable to tolerate Entresto due to hypotensions.  Went back to [place].  Increased urinate and stopped lasix.    2023: New CP.  Given Prn Nitro.  He deferred an ischemic eval.    Patient notes that he is doing well.   There are no interval hospital/ED visit.   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 No Nitro needed  No SOB/DOE and no PND/Orthopnea.  No weight gain or leg swelling.  No PRN lasix needed. No palpitations or syncope.   Past Medical History:  Diagnosis Date   Abnormal EKG    DM (diabetes mellitus) (HCC)    Dry cough    Food poisoning    Hyperlipidemia    SOB (shortness of breath)    Thrombocytopenia (HCC)    Vomiting    [place]    Past Surgical History:  Procedure Laterality Date   NO PAST SURGERIES      Current  Medications: Current Meds  Medication Sig   aspirin 81 MG EC tablet Take 1 tablet (81 mg total) by mouth daily. Swallow whole.   glipiZIDE-metformin (METAGLIP) 5-500 MG tablet Take 2 tablets by mouth 2 (two) times daily.   nitroGLYCERIN (NITROSTAT) 0.4 MG SL tablet Place 1 tablet (0.4 mg total) under the tongue every 5 (five) minutes as needed for chest pain.   predniSONE (STERAPRED UNI-PAK 21 TAB) 10 MG (21) TBPK tablet Take by mouth daily. Take 6 tabs by mouth daily  for 2 days, then 5 tabs for 2 days, then 4 tabs for 2 days, then 3 tabs for 2 days, 2 tabs for 2 days, then 1 tab by mouth daily for 2 days   [DISCONTINUED] atorvastatin (LIPITOR) 40 MG tablet Take 1 tablet (40 mg total) by mouth daily.   [DISCONTINUED] empagliflozin (JARDIANCE) 10 MG TABS tablet Take 1 tablet (10 mg total) by mouth daily before breakfast.   [DISCONTINUED] metoprolol succinate (TOPROL-XL) 25 MG 24 hr tablet Take 1 tablet (25 mg total) by mouth daily.   [DISCONTINUED] sacubitril-valsartan (ENTRESTO) 24-26 MG Take 1 tablet by mouth every 12 (twelve) hours.    Allergies:   Patient has no active allergies.   Social History   Socioeconomic History   Marital status: Married  Spouse name: Not on file   Number of children: Not on file   Years of education: Not on file   Highest education level: Not on file  Occupational History   Not on file  Tobacco Use   Smoking status: Never   Smokeless tobacco: Never  Vaping Use   Vaping Use: Every day  Substance and Sexual Activity   Alcohol use: Not Currently   Drug use: Never   Sexual activity: Not on file  Other Topics Concern   Not on file  Social History Narrative   Not on file   Social Determinants of Health   Financial Resource Strain: Not on file  Food Insecurity: Not on file  Transportation Needs: Not on file  Physical Activity: Not on file  Stress: Not on file  Social Connections: Not on file   Family History: The patient's family history  includes Cancer - Prostate in his father; Healthy in his daughter, daughter, daughter, son, and son.  ROS:   Please see the history of present illness.    All other systems reviewed and are negative.  EKGs/Labs/Other Studies Reviewed:    The following studies were reviewed today:  EKG:  11/78/23: Sinus bradycardia low voltage and anteroseptal infarct, stable TWI 01/18/21: Sinus bradycardia rate 56 low voltage and anterior infract 12/25/19: Sinus rhythm, rate 70, anterior Q waves with deep anterior and anterolateral TWI.  Transthoracic Echocardiogram: Date: 03/29/20 Results:  1. LVEF remains severely reduced ~25-30%. The mid septum and apical  segments are akineitic consistent with LAD infarction. No LV thrombus. EF  roughly the same. Left ventricular ejection fraction, by estimation, is 25  to 30%. The left ventricle has  severely decreased function. The left ventricle demonstrates regional wall  motion abnormalities (see scoring diagram/findings for description). The  left ventricular internal cavity size was mildly to moderately dilated.  Left ventricular diastolic  parameters are consistent with Grade I diastolic dysfunction (impaired  relaxation).   2. Right ventricular systolic function is normal. The right ventricular  size is normal. There is normal pulmonary artery systolic pressure. The  estimated right ventricular systolic pressure is 99991111 mmHg.   3. The mitral valve is grossly normal. Trivial mitral valve  regurgitation. No evidence of mitral stenosis.   4. The aortic valve is tricuspid. Aortic valve regurgitation is not  visualized. No aortic stenosis is present.   5. The inferior vena cava is dilated in size with >50% respiratory  variability, suggesting right atrial pressure of 8 mmHg.   CardiacCT: Date: 02/18/2020 Results: IMPRESSION: 1. Coronary artery calcium score 174 Agatston units. This places the patient in the 60th percentile for age and gender,  suggesting intermediate risk for future cardiac events.  2.  Possible severe distal RCA stenosis.  3.  Possible severe stenosis in a moderate-sized D1. 4. Aortic Atherosclerosis  Small right lower lobe pulmonary nodules measuring 5 mm, nonspecific, but statistically likely benign. No follow-up needed if patient is low-risk (and has no known or suspected primary neoplasm). Non-contrast chest CT can be considered in 12 months if patient is high-risk. This recommendation follows the consensus statement: Guidelines for Management of Incidental Pulmonary Nodules Detected on CT Images: From the Fleischner Society 2017; Radiology 2017; 284:228-243.  Cardiac MRI: Date: 12/10/2019 Results: 1. Moderate to severely dilated left ventricle, thin myocardium, and systolic function is moderately reduced (LVEF =36%). There is mid anterior, anteroseptal, and inferoseptal hypokinesis, as well as apical hypokinesis (all territories). No LV thrombus is noted. There is transmural late  gadolinium enhancement: Inferoseptal mid and apex, anteroseptal mid and apex, anterior mid and apex, anterolateral apex, and true apex. These segments are likely nonviable.   LVEDD: 63 mm  LVESD: 41 mm  LVEDVi:132 mL/m2  SVi:48 mL/m2  CI: 3.08 L/min/m2  Myocardial mass/BSA:77 g/m2   2. Normal right ventricular size, thickness and systolic function (RVEF 99991111). There are no regional wall motion abnormalities.   3.  Normal left and right atrial size.  LVESV 70 mL  RVESV 26 mL   4. Normal size of the aortic root, ascending aorta and pulmonary artery.   5.  No significant valvular abnormalities.   6.  Normal pericardium.  No pericardial effusion.   IMPRESSION: Moderate to severely dilated left ventricle, thin myocardium, and systolic function is moderately reduced (LVEF =36%). There is mid anterior, anteroseptal, and inferoseptal hypokinesis, as well as apical hypokinesis (all territories). No LV thrombus is  noted. There is transmural late gadolinium enhancement: Inferoseptal mid and apex, anteroseptal mid and apex, anterior mid and apex, anterolateral apex, and true apex. These segments are likely nonviable.   Recent Labs: 01/19/2021: BUN 20; Creatinine, Ser 1.04; Magnesium 1.8; NT-Pro BNP 602; Potassium 4.7; Sodium 138   Physical Exam:    VS:  BP (!) 112/50   Pulse (!) 53   Ht 5\' 4"  (1.626 m)   Wt 156 lb (70.8 kg)   SpO2 97%   BMI 26.78 kg/m     Wt Readings from Last 3 Encounters:  12/14/21 156 lb (70.8 kg)  07/13/21 161 lb (73 kg)  02/15/21 168 lb (76.2 kg)    Gen: no distress   Neck: No JVD Ears:  Bilateral Frank Sign Cardiac: No Rubs or Gallops, no [place], regular bradycardia, +2 radial pulses Respiratory: Clear to auscultation bilaterally, normal effort, normal  respiratory rate GI: Soft, nontender, non-distended  MS: no edema;  moves all extremities Integument: Skin feels warm Neuro:  At time of evaluation, alert and oriented to person/place/time/situation  Psych: Normal affect, patient feels   ASSESSMENT:    1. HFrEF (heart failure with reduced ejection fraction) ([place])   2. Coronary artery disease of native artery of native heart with stable angina pectoris ([place])   3. Aortic atherosclerosis ([place])   4. Hyperlipidemia associated with type 2 diabetes mellitus ([place])   5. Type 2 diabetes mellitus with hyperlipidemia (HCC)     PLAN:    Coronary Artery Disease; Moderate Disease HLD with DM Aortic atherosclerosis - asymptomatic  - anatomy: D1 disease, distal LAD disease, distal RCA disease - continue ASA 81 mg -  atorvastatin 40 , goal LDL < 70 (after SDM) - continue metoprolol succinate 25 ( he has tolerated HR of 50s with no sx) - has need no PRN nitro - we have discussion; if he were to have angina or HF sx he is amenable to consider LHC  Heart Failure with reduced ejection fraction - NYHA class I, Stage B, euvolemic, likely coronary mediated - Will  give  lasix 20 mg PO as PRN (none needed) - Jardiance 10  - metoprolol succinate 25 mg PO Daily - Hypotension with higher Entresto (on 24/26)- continue - he notes that his insurance no longer covers ARNI or SGLT2i ; will reach out to our pharmacy teammates; I am not sure that is the case   He and family know that if they would like to re-consider ICD for primary prevention I am happy to either refer him to EP or discuss with him again  Time Spent Directly with Patient:   I have spent a total of 40 minutes with the patient reviewing notes, imaging, EKGs, labs and examining the patient as well as establishing an assessment and plan that was discussed personally with the patient.  > 50% of time was spent in direct patient care and family and reviewing imaging with patient.   Medication Adjustments/Labs and Tests Ordered: Current medicines are reviewed at length with the patient today.  Concerns regarding medicines are outlined above.  Orders Placed This Encounter  Procedures   EKG 12-Lead    Meds ordered this encounter  Medications   sacubitril-valsartan (ENTRESTO) 24-26 MG    Sig: Take 1 tablet by mouth every 12 (twelve) hours.    Dispense:  180 tablet    Refill:  3   metoprolol succinate (TOPROL-XL) 25 MG 24 hr tablet    Sig: Take 1 tablet (25 mg total) by mouth daily.    Dispense:  90 tablet    Refill:  3   atorvastatin (LIPITOR) 40 MG tablet    Sig: Take 1 tablet (40 mg total) by mouth daily.    Dispense:  90 tablet    Refill:  3   empagliflozin (JARDIANCE) 10 MG TABS tablet    Sig: Take 1 tablet (10 mg total) by mouth daily before breakfast.    Dispense:  90 tablet    Refill:  3     Patient Instructions  Medication Instructions:  Your physician recommends that you continue on your current medications as directed. Please refer to the Current Medication list given to you today. REFILLED: Ferne Coe, Toprol-XL, and Atorvastatin  *If you need a refill on your  cardiac medications before your next appointment, please call your pharmacy*   Lab Work: NONE If you have labs (blood work) drawn today and your tests are completely normal, you will receive your results only by: [place] (if you have MyChart) OR A paper copy in the mail If you have any lab test that is abnormal or we need to change your treatment, we will call you to review the results.   Testing/Procedures: NONE   Follow-Up: At [facility], you and your health needs are our priority.  As part of our continuing mission to provide you with exceptional heart care, we have created designated Provider Care Teams.  These Care Teams include your primary Cardiologist (physician) and Advanced Practice Providers (APPs -  Physician Assistants and Nurse Practitioners) who all work together to provide you with the care you need, when you need it.  We recommend signing up for the patient portal called "MyChart".  Sign up information is provided on this After Visit Summary.  MyChart is used to connect with patients for Virtual Visits (Telemedicine).  Patients are able to view lab/test results, encounter notes, upcoming appointments, etc.  Non-urgent messages can be sent to your provider as well.   To learn more about what you can do with MyChart, go to [email or web address].    Your next appointment:   1 year(s)  The format for your next appointment:   In Person  Provider:   Werner Lean, MD     Important Information About Sugar         Signed, Werner Lean, MD  12/14/2021 9:50 AM    [place]

## 2021-12-14 ENCOUNTER — Ambulatory Visit: Payer: Medicare Other | Attending: Internal Medicine | Admitting: Internal Medicine

## 2021-12-14 ENCOUNTER — Encounter: Payer: Self-pay | Admitting: Internal Medicine

## 2021-12-14 VITALS — BP 112/50 | HR 53 | Ht 64.0 in | Wt 156.0 lb

## 2021-12-14 DIAGNOSIS — E1169 Type 2 diabetes mellitus with other specified complication: Secondary | ICD-10-CM | POA: Diagnosis not present

## 2021-12-14 DIAGNOSIS — I700 Atherosclerosis of aorta: Secondary | ICD-10-CM

## 2021-12-14 DIAGNOSIS — E785 Hyperlipidemia, unspecified: Secondary | ICD-10-CM | POA: Diagnosis not present

## 2021-12-14 DIAGNOSIS — I25118 Atherosclerotic heart disease of native coronary artery with other forms of angina pectoris: Secondary | ICD-10-CM | POA: Diagnosis not present

## 2021-12-14 DIAGNOSIS — I5020 Unspecified systolic (congestive) heart failure: Secondary | ICD-10-CM

## 2021-12-14 MED ORDER — ENTRESTO 24-26 MG PO TABS
1.0000 | ORAL_TABLET | Freq: Two times a day (BID) | ORAL | 3 refills | Status: DC
Start: 2021-12-14 — End: 2022-12-22

## 2021-12-14 MED ORDER — ATORVASTATIN CALCIUM 40 MG PO TABS: 40.0000 mg | ORAL_TABLET | Freq: Every day | ORAL | 3 refills | Status: DC

## 2021-12-14 MED ORDER — EMPAGLIFLOZIN 10 MG PO TABS: 10.0000 mg | ORAL_TABLET | Freq: Every day | ORAL | 3 refills | Status: DC

## 2021-12-14 MED ORDER — METOPROLOL SUCCINATE ER 25 MG PO TB24: 25.0000 mg | ORAL_TABLET | Freq: Every day | ORAL | 3 refills | Status: DC

## 2021-12-14 NOTE — Patient Instructions (Signed)
Medication Instructions:  Your physician recommends that you continue on your current medications as directed. Please refer to the Current Medication list given to you today. REFILLED: Valla Leaver, Toprol-XL, and Atorvastatin  *If you need a refill on your cardiac medications before your next appointment, please call your pharmacy*   Lab Work: NONE If you have labs (blood work) drawn today and your tests are completely normal, you will receive your results only by: MyChart Message (if you have MyChart) OR A paper copy in the mail If you have any lab test that is abnormal or we need to change your treatment, we will call you to review the results.   Testing/Procedures: NONE   Follow-Up: At [facility], you and your health needs are our priority.  As part of our continuing mission to provide you with exceptional heart care, we have created designated Provider Care Teams.  These Care Teams include your primary Cardiologist (physician) and Advanced Practice Providers (APPs -  Physician Assistants and Nurse Practitioners) who all work together to provide you with the care you need, when you need it.  We recommend signing up for the patient portal called "MyChart".  Sign up information is provided on this After Visit Summary.  MyChart is used to connect with patients for Virtual Visits (Telemedicine).  Patients are able to view lab/test results, encounter notes, upcoming appointments, etc.  Non-urgent messages can be sent to your provider as well.   To learn more about what you can do with MyChart, go to [email or web address].    Your next appointment:   1 year(s)  The format for your next appointment:   In Person  Provider:   Christell Constant, MD     Important Information About Sugar

## 2022-06-02 DIAGNOSIS — E119 Type 2 diabetes mellitus without complications: Secondary | ICD-10-CM | POA: Diagnosis not present

## 2022-06-02 DIAGNOSIS — D696 Thrombocytopenia, unspecified: Secondary | ICD-10-CM | POA: Diagnosis not present

## 2022-06-02 DIAGNOSIS — I5020 Unspecified systolic (congestive) heart failure: Secondary | ICD-10-CM | POA: Diagnosis not present

## 2022-06-02 DIAGNOSIS — E1169 Type 2 diabetes mellitus with other specified complication: Secondary | ICD-10-CM | POA: Diagnosis not present

## 2022-06-02 DIAGNOSIS — I700 Atherosclerosis of aorta: Secondary | ICD-10-CM | POA: Diagnosis not present

## 2022-11-21 DIAGNOSIS — Z0000 Encounter for general adult medical examination without abnormal findings: Secondary | ICD-10-CM | POA: Diagnosis not present

## 2022-11-21 DIAGNOSIS — E113292 Type 2 diabetes mellitus with mild nonproliferative diabetic retinopathy without macular edema, left eye: Secondary | ICD-10-CM | POA: Diagnosis not present

## 2022-11-21 DIAGNOSIS — E1169 Type 2 diabetes mellitus with other specified complication: Secondary | ICD-10-CM | POA: Diagnosis not present

## 2022-11-21 DIAGNOSIS — Z23 Encounter for immunization: Secondary | ICD-10-CM | POA: Diagnosis not present

## 2022-11-21 DIAGNOSIS — I700 Atherosclerosis of aorta: Secondary | ICD-10-CM | POA: Diagnosis not present

## 2022-11-21 DIAGNOSIS — E261 Secondary hyperaldosteronism: Secondary | ICD-10-CM | POA: Diagnosis not present

## 2022-11-21 DIAGNOSIS — R809 Proteinuria, unspecified: Secondary | ICD-10-CM | POA: Diagnosis not present

## 2022-11-21 DIAGNOSIS — E782 Mixed hyperlipidemia: Secondary | ICD-10-CM | POA: Diagnosis not present

## 2022-11-21 DIAGNOSIS — D696 Thrombocytopenia, unspecified: Secondary | ICD-10-CM | POA: Diagnosis not present

## 2022-11-21 DIAGNOSIS — I5020 Unspecified systolic (congestive) heart failure: Secondary | ICD-10-CM | POA: Diagnosis not present

## 2022-11-21 DIAGNOSIS — I25118 Atherosclerotic heart disease of native coronary artery with other forms of angina pectoris: Secondary | ICD-10-CM | POA: Diagnosis not present

## 2022-11-21 DIAGNOSIS — E1159 Type 2 diabetes mellitus with other circulatory complications: Secondary | ICD-10-CM | POA: Diagnosis not present

## 2022-11-30 DIAGNOSIS — I5020 Unspecified systolic (congestive) heart failure: Secondary | ICD-10-CM | POA: Diagnosis not present

## 2022-12-22 ENCOUNTER — Other Ambulatory Visit: Payer: Self-pay | Admitting: Internal Medicine

## 2022-12-22 MED ORDER — ENTRESTO 24-26 MG PO TABS: 1.0000 | ORAL_TABLET | Freq: Two times a day (BID) | ORAL | 0 refills | Status: DC

## 2023-01-20 ENCOUNTER — Other Ambulatory Visit: Payer: Self-pay | Admitting: Internal Medicine

## 2023-01-28 ENCOUNTER — Other Ambulatory Visit: Payer: Self-pay | Admitting: Internal Medicine

## 2023-02-05 ENCOUNTER — Other Ambulatory Visit: Payer: Self-pay | Admitting: Internal Medicine

## 2023-02-10 ENCOUNTER — Other Ambulatory Visit: Payer: Self-pay | Admitting: Internal Medicine

## 2023-02-27 ENCOUNTER — Other Ambulatory Visit: Payer: Self-pay | Admitting: Internal Medicine

## 2023-03-07 ENCOUNTER — Other Ambulatory Visit: Payer: Self-pay | Admitting: Internal Medicine

## 2023-03-08 ENCOUNTER — Other Ambulatory Visit: Payer: Self-pay | Admitting: Internal Medicine

## 2023-03-13 ENCOUNTER — Telehealth: Payer: Self-pay | Admitting: Internal Medicine

## 2023-03-13 MED ORDER — ENTRESTO 24-26 MG PO TABS: 1.0000 | ORAL_TABLET | Freq: Two times a day (BID) | ORAL | 0 refills | Status: DC

## 2023-03-13 MED ORDER — EMPAGLIFLOZIN 10 MG PO TABS: 10.0000 mg | ORAL_TABLET | Freq: Every day | ORAL | 0 refills | Status: DC

## 2023-03-13 NOTE — Telephone Encounter (Signed)
 Pt's medication was sent to pt's pharmacy as requested. Confirmation received.

## 2023-03-13 NOTE — Telephone Encounter (Signed)
*  STAT* If patient is at the pharmacy, call can be transferred to refill team.   1. Which medications need to be refilled? (please list name of each medication and dose if known) empagliflozin  (JARDIANCE ) 10 MG TABS tablet  sacubitril-valsartan (ENTRESTO ) 24-26 MG   2. Would you like to learn more about the convenience, safety, & potential cost savings by using the [facility]? No   3. Are you open to using the [facility] (Type [facility]. ) No   4. Which pharmacy/location (including street and city if local pharmacy) is medication to be sent to? [id number] - [place], [place] - [address] AT [address]    5. Do they need a 30 day or 90 day supply? 90 day   Pt is out of medication and has scheduled appt on 05/17/23

## 2023-04-06 ENCOUNTER — Other Ambulatory Visit: Payer: Self-pay | Admitting: Internal Medicine

## 2023-05-17 ENCOUNTER — Encounter: Payer: Self-pay | Admitting: Internal Medicine

## 2023-05-17 ENCOUNTER — Ambulatory Visit: Payer: PRIVATE HEALTH INSURANCE | Attending: Internal Medicine | Admitting: Internal Medicine

## 2023-05-17 VITALS — BP 110/60 | HR 63 | Ht 66.0 in | Wt 157.0 lb

## 2023-05-17 DIAGNOSIS — I25118 Atherosclerotic heart disease of native coronary artery with other forms of angina pectoris: Secondary | ICD-10-CM

## 2023-05-17 DIAGNOSIS — I5020 Unspecified systolic (congestive) heart failure: Secondary | ICD-10-CM

## 2023-05-17 DIAGNOSIS — I700 Atherosclerosis of aorta: Secondary | ICD-10-CM | POA: Diagnosis not present

## 2023-05-17 NOTE — Patient Instructions (Signed)
 Medication Instructions:  Your physician recommends that you continue on your current medications as directed. Please refer to the Current Medication list given to you today.  *If you need a refill on your cardiac medications before your next appointment, please call your pharmacy*  Testing/Procedures: Your physician has requested that you have an echocardiogram. Echocardiography is a painless test that uses sound waves to create images of your heart. It provides your doctor with information about the size and shape of your heart and how well your heart's chambers and valves are working. This procedure takes approximately one hour. There are no restrictions for this procedure. Please do NOT wear cologne, perfume, aftershave, or lotions (deodorant is allowed). Please arrive 15 minutes prior to your appointment time.  Please note: We ask at that you not bring children with you during ultrasound (echo/ vascular) testing. Due to room size and safety concerns, children are not allowed in the ultrasound rooms during exams. Our front office staff cannot provide observation of children in our lobby area while testing is being conducted. An adult accompanying a patient to their appointment will only be allowed in the ultrasound room at the discretion of the ultrasound technician under special circumstances. We apologize for any inconvenience.   Follow-Up: At [facility], you and your health needs are our priority.  As part of our continuing mission to provide you with exceptional heart care, we have created designated Provider Care Teams.  These Care Teams include your primary Cardiologist (physician) and Advanced Practice Providers (APPs -  Physician Assistants and Nurse Practitioners) who all work together to provide you with the care you need, when you need it.  Your next appointment:   1 year(s)  The format for your next appointment:   In Person  Provider:   Christell Constant, MD {  Other  Instructions   1st Floor: - Lobby - Registration  - Pharmacy  - Lab - Cafe  2nd Floor: - PV Lab - Diagnostic Testing (echo, CT, nuclear med)  3rd Floor: - Vacant  4th Floor: - TCTS (cardiothoracic surgery) - AFib Clinic - Structural Heart Clinic - Vascular Surgery  - Vascular Ultrasound  5th Floor: - HeartCare Cardiology (general and EP) - Clinical Pharmacy for coumadin, hypertension, lipid, weight-loss medications, and med management appointments    Valet parking services will be available as well.

## 2023-05-17 NOTE — Progress Notes (Signed)
 Cardiology Office Note:    Date:  05/17/2023   ID:  [name], DOB 1950/07/14, MRN [id number]  PCP:  Soundra Pilon, FNP  [facility] Cardiologist:  Christell Constant, MD  [facility] Electrophysiologist:  None   Referring MD: Soundra Pilon, FNP   CC: Follow up HR  History of Present Illness:    Bryan Melendez is a 73 y.o. male with a hx of Coronary Artery Disease, Aortic Atherosclerosis & HLD &  T2DM who had a likely NSTEMI 06/2019 in [place], seen in consultation 10/20/19.  Through our care, he has refused LHC multiple times; we will not re-engage unless he has new symptoms.  Similarly had deferred EP eval for ICD.  Almost always refuses [place] interpretor in favor of his son.  Bryan Melendez is a 73 year old male who presents for a cardiovascular follow-up. He is accompanied by his son.  He has a known heart murmur, last evaluated with an echocardiogram in 2022. No symptoms related to the heart murmur, such as shortness of breath or palpitations, are present.  He feels well overall with no current symptoms of chest pain or breathing difficulties. No new symptoms or concerns since his last visit. Cholesterol and blood pressure levels have been stable and well-controlled, with no changes in his medication regimen.  In terms of general health, he experiences typical seasonal allergies and occasional headaches, but no significant illnesses or health issues over the past year. He recalls having a regular flu in the past year but nothing out of the ordinary.   Social 2021: Found to have HFrEF (20-25%) 11/12/19 with LAD WMAs and NT-proBNP of 2433.  On GDMT (lasix 20 mg, losartan 25 mg-> Entresto, metoprolol succinate 25 mg), NT-proBNP improved to 837, MR was ~35% with evidence of apical transmural scar. CCTA showed LAD disease and distal RCA disease.   2022: EF~ 30%.  Was unable to tolerate Entresto due to hypotensions.  Went back to [place].  Increased urinate and stopped lasix.    2023: New CP.  Given Prn Nitro.  He deferred an ischemic eval.    Discussed the use of AI scribe software for clinical note transcription with the patient, who gave verbal consent to proceed.   Past Medical History:  Diagnosis Date   Abnormal EKG    DM (diabetes mellitus) (HCC)    Dry cough    Food poisoning    Hyperlipidemia    SOB (shortness of breath)    Thrombocytopenia (HCC)    Vomiting    [place]    Past Surgical History:  Procedure Laterality Date   NO PAST SURGERIES      Current Medications: Current Meds  Medication Sig   atorvastatin (LIPITOR) 40 MG tablet TAKE 1 TABLET PLEASE CALL [phone] TO SCHEDULE AN OVERDUE APPOINTMENT FOR FUTURE REFILLS   empagliflozin (JARDIANCE) 10 MG TABS tablet Take 1 tablet (10 mg total) by mouth daily before breakfast.   glipiZIDE-metformin (METAGLIP) 5-500 MG tablet Take 2 tablets by mouth 2 (two) times daily.   nitroGLYCERIN (NITROSTAT) 0.4 MG SL tablet Place 1 tablet (0.4 mg total) under the tongue every 5 (five) minutes as needed for chest pain.   sacubitril-valsartan (ENTRESTO) 24-26 MG Take 1 tablet by mouth every 12 (twelve) hours.    Allergies:   Patient has no active allergies.   Social History   Socioeconomic History   Marital status: Married    Spouse name: Not on file   Number of children: Not on  file   Years of education: Not on file   Highest education level: Not on file  Occupational History   Not on file  Tobacco Use   Smoking status: Never   Smokeless tobacco: Never  Vaping Use   Vaping status: Every Day  Substance and Sexual Activity   Alcohol use: Not Currently   Drug use: Never   Sexual activity: Not on file  Other Topics Concern   Not on file  Social History Narrative   Not on file   Social Drivers of Health   Financial Resource Strain: Not on file  Food Insecurity: Not on file  Transportation Needs: Not on file  Physical Activity: Not on file  Stress: Not on file  Social Connections:  Not on file   Family History: The patient's family history includes Cancer - Prostate in his father; Healthy in his daughter, daughter, daughter, son, and son.  ROS:   Please see the history of present illness.     EKGs/Labs/Other Studies Reviewed:    Cardiac Studies & Procedures   ______________________________________________________________________________________________     ECHOCARDIOGRAM  ECHOCARDIOGRAM COMPLETE 03/29/2020  Narrative ECHOCARDIOGRAM REPORT    Patient Name:   Bryan Melendez    Date of Exam: 03/29/2020 Medical Rec #:  [id number]     Height:       63.0 in Accession #:    [id number]    Weight:       158.2 lb Date of Birth:  15-Jun-1950     BSA:          1.750 m Patient Age:    69 years      BP:           110/62 mmHg Patient Gender: M             HR:           58 bpm. Exam Location:  [address]  Procedure: 2D Echo, Cardiac Doppler, Color Doppler and Intracardiac Opacification Agent  Indications:    I50.2 CHF  History:        Patient has prior history of Echocardiogram examinations, most recent 11/12/2019. CHF, CAD; Risk Factors:Diabetes and Dyslipidemia.  Sonographer:    Garald Braver, RDCS Referring Phys: [id number] [facility] Avia Merkley  IMPRESSIONS   1. LVEF remains severely reduced ~25-30%. The mid septum and apical segments are akineitic consistent with LAD infarction. No LV thrombus. EF roughly the same. Left ventricular ejection fraction, by estimation, is 25 to 30%. The left ventricle has severely decreased function. The left ventricle demonstrates regional wall motion abnormalities (see scoring diagram/findings for description). The left ventricular internal cavity size was mildly to moderately dilated. Left ventricular diastolic parameters are consistent with Grade I diastolic dysfunction (impaired relaxation). 2. Right ventricular systolic function is normal. The right ventricular size is normal. There is normal pulmonary artery systolic pressure.  The estimated right ventricular systolic pressure is 34.4 mmHg. 3. The mitral valve is grossly normal. Trivial mitral valve regurgitation. No evidence of mitral stenosis. 4. The aortic valve is tricuspid. Aortic valve regurgitation is not visualized. No aortic stenosis is present. 5. The inferior vena cava is dilated in size with >50% respiratory variability, suggesting right atrial pressure of 8 mmHg.  Comparison(s): No significant change from prior study. 11/12/19 EF 20-25%.  Conclusion(s)/Recommendation(s): No left ventricular mural or apical thrombus/thrombi.  FINDINGS Left Ventricle: LVEF remains severely reduced ~25-30%. The mid septum and apical segments are akineitic consistent with LAD infarction. No LV thrombus. EF roughly the  same. Left ventricular ejection fraction, by estimation, is 25 to 30%. The left ventricle has severely decreased function. The left ventricle demonstrates regional wall motion abnormalities. Definity contrast agent was given IV to delineate the left ventricular endocardial borders. The left ventricular internal cavity size was mildly to moderately dilated. There is no left ventricular hypertrophy. Left ventricular diastolic parameters are consistent with Grade I diastolic dysfunction (impaired relaxation).   LV Wall Scoring: The mid and distal anterior septum, apical anterior segment, apical inferior segment, and apex are akinetic.  Right Ventricle: The right ventricular size is normal. No increase in right ventricular wall thickness. Right ventricular systolic function is normal. There is normal pulmonary artery systolic pressure. The tricuspid regurgitant velocity is 2.57 m/s, and with an assumed right atrial pressure of 8 mmHg, the estimated right ventricular systolic pressure is 34.4 mmHg.  Left Atrium: Left atrial size was normal in size.  Right Atrium: Right atrial size was normal in size.  Pericardium: There is no evidence of pericardial  effusion.  Mitral Valve: The mitral valve is grossly normal. Mild mitral annular calcification. Trivial mitral valve regurgitation. No evidence of mitral valve stenosis.  Tricuspid Valve: The tricuspid valve is grossly normal. Tricuspid valve regurgitation is mild . No evidence of tricuspid stenosis.  Aortic Valve: The aortic valve is tricuspid. Aortic valve regurgitation is not visualized. No aortic stenosis is present.  Pulmonic Valve: The pulmonic valve was grossly normal. Pulmonic valve regurgitation is not visualized. No evidence of pulmonic stenosis.  Aorta: The aortic root and ascending aorta are structurally normal, with no evidence of dilitation.  Venous: The inferior vena cava is dilated in size with greater than 50% respiratory variability, suggesting right atrial pressure of 8 mmHg.  IAS/Shunts: The atrial septum is grossly normal.   LEFT VENTRICLE PLAX 2D LVIDd:         6.50 cm  Diastology LVIDs:         5.35 cm  LV e' medial:    5.02 cm/s LV PW:         0.80 cm  LV E/e' medial:  13.9 LV IVS:        0.80 cm  LV e' lateral:   8.04 cm/s LVOT diam:     2.20 cm  LV E/e' lateral: 8.7 LV SV:         93 LV SV Index:   53 LVOT Area:     3.80 cm   RIGHT VENTRICLE RV Basal diam:  3.60 cm RV S prime:     9.14 cm/s TAPSE (M-mode): 2.2 cm  LEFT ATRIUM             Index       RIGHT ATRIUM           Index LA diam:        4.40 cm 2.51 cm/m  RA Area:     14.20 cm LA Vol (A2C):   56.7 ml 32.40 ml/m RA Volume:   33.10 ml  18.91 ml/m LA Vol (A4C):   59.1 ml 33.77 ml/m LA Biplane Vol: 59.4 ml 33.94 ml/m AORTIC VALVE LVOT Vmax:   98.60 cm/s LVOT Vmean:  67.500 cm/s LVOT VTI:    0.244 m  AORTA Ao Root diam: 3.80 cm Ao Asc diam:  3.60 cm  MITRAL VALVE               TRICUSPID VALVE TR Peak grad:   26.4 mmHg TR Vmax:  257.00 cm/s MV E velocity: 69.80 cm/s MV A velocity: 76.70 cm/s  SHUNTS MV E/A ratio:  0.91        Systemic VTI:  0.24 m Systemic Diam: 2.20  cm  Bryan Odor MD Electronically signed by Bryan Odor MD Signature Date/Time: 03/29/2020/3:14:31 PM    Final      CT SCANS  CT CORONARY FRACTIONAL FLOW RESERVE DATA PREP 02/18/2020  Narrative CLINICAL DATA:  Chest pain  EXAM: CT FFR  MEDICATIONS: No additional medications.  TECHNIQUE: The coronary CTA was sent for FFR  FINDINGS:  FFR 0.69 distal RCA.  FFR 0.92 mid LAD  FFR 0.65 distal LAD  IMPRESSION: 1.  Hemodynamically significant distal RCA stenosis.  2. Low FFR in the distal LAD. There does not appear to be a discrete stenosis causing this, possible cumulative effect of extensive plaque in the proximal to mid LAD.  Bryan Melendez   Electronically Signed By: Marca Ancona M.D. On: 02/19/2020 21:16   CT CORONARY MORPH W/CTA COR W/SCORE 02/18/2020  Addendum 02/18/2020  8:54 PM ADDENDUM REPORT: 02/18/2020 20:51  CLINICAL DATA:  Chest pain  EXAM: Cardiac CTA  MEDICATIONS: Sub lingual nitro. 4mg  x 2  : The patient was scanned on a Siemens 192 slice scanner. Gantry rotation speed was 250 msecs. Collimation was 0.6 mm. A 100 kV prospective scan was triggered in the ascending thoracic aorta at 35-75% of the R-R interval. Average HR during the scan was 60 bpm. The 3D data set was interpreted on a dedicated work station using MPR, MIP and VRT modes. A total of 80cc of contrast was used.  FINDINGS: Non-cardiac: See separate report from [facility].  The pulmonary veins drain normally to the left atrium. No significant LA appendage thrombus.  Calcium Score: 174 Agatston units.  Coronary Arteries: Right dominant with no anomalies  LM: No plaque or stenosis.  LAD system: Mixed plaque ostial LAD, mild (<50%) stenosis. Mixed plaque proximal and mid LAD, probably no more than mild (<50%) stenosis. Moderate-sized D1 with calcified plaque ostially and mixed plaque proximally. Possible severe (70-90%) stenosis.  Circumflex system:  Mixed plaque proximally, mild (<50%) stenosis.  RCA system: Mixed plaque proximal RCA, mild (<50%) stenosis. Mixed plaque distal RCA, possibly severe (70-90%) stenosis.  IMPRESSION: 1. Coronary artery calcium score 174 Agatston units. This places the patient in the 60th percentile for age and gender, suggesting intermediate risk for future cardiac events.  2.  Possible severe distal RCA stenosis.  3.  Possible severe stenosis in a moderate-sized D1.  Will send for FFR.  Bryan Melendez   Electronically Signed By: Marca Ancona M.D. On: 02/18/2020 20:51  Narrative EXAM: OVER-READ INTERPRETATION  CT CHEST  The following report is an over-read performed by radiologist Dr. Trudie Reed of [facility] on 02/18/2020. This over-read does not include interpretation of cardiac or coronary anatomy or pathology. The coronary calcium score/coronary CTA interpretation by the cardiologist is attached.  COMPARISON:  None.  FINDINGS: Aortic atherosclerosis. Small 5 mm pulmonary nodules in the superior segment of the right lower lobe (axial images 7 and 14 of series 12). Small calcified granuloma in the left lower lobe. Within the visualized portions of the thorax there are no other larger more suspicious appearing pulmonary nodules or masses, there is no acute consolidative airspace disease, no pleural effusions, no pneumothorax and no lymphadenopathy. Visualized portions of the upper abdomen are unremarkable. There are no aggressive appearing lytic or blastic lesions noted in the visualized portions of the  skeleton.  IMPRESSION: 1. Small right lower lobe pulmonary nodules measuring 5 mm, nonspecific, but statistically likely benign. No follow-up needed if patient is low-risk (and has no known or suspected primary neoplasm). Non-contrast chest CT can be considered in 12 months if patient is high-risk. This recommendation follows the consensus statement: Guidelines  for Management of Incidental Pulmonary Nodules Detected on CT Images: From the Fleischner Society 2017; Radiology 2017; 284:228-243. 2.  Aortic Atherosclerosis (ICD10-I70.0).  Electronically Signed: By: Trudie Reed M.D. On: 02/18/2020 09:34   CARDIAC MRI  MR CARDIAC MORPHOLOGY W WO CONTRAST 12/10/2019  Narrative CLINICAL DATA:  73 year old white male.  Unknown hematocrit.  EXAM: CARDIAC MRI  TECHNIQUE: The patient was scanned on a 1.5 Tesla GE magnet. A dedicated cardiac coil was used. Functional imaging was done using Fiesta sequences. 2,3, and 4 chamber views were done to assess for RWMA's. Modified Simpson's rule using a short axis stack was used to calculate an ejection fraction on a dedicated work [employer]. The patient received 8 cc of Gadavist. After 10 minutes inversion recovery sequences were used to assess for infiltration and scar tissue.  CONTRAST:  8 cc  of Gadavist  FINDINGS: 1. Moderate to severely dilated left ventricle, thin myocardium, and systolic function is moderately reduced (LVEF =36%). There is mid anterior, anteroseptal, and inferoseptal hypokinesis, as well as apical hypokinesis (all territories). No LV thrombus is noted. There is transmural late gadolinium enhancement: Inferoseptal mid and apex, anteroseptal mid and apex, anterior mid and apex, anterolateral apex, and true apex. These segments are likely nonviable.  LVEDD: 63 mm  LVESD: 41 mm  LVEDVi:132 mL/m2  SVi:48 mL/m2  CI: 3.08 L/min/m2  Myocardial mass/BSA:77 g/m2  2. Normal right ventricular size, thickness and systolic function (RVEF =58%). There are no regional wall motion abnormalities.  3.  Normal left and right atrial size.  LVESV 70 mL  RVESV 26 mL  4. Normal size of the aortic root, ascending aorta and pulmonary artery.  5.  No significant valvular abnormalities.  6.  Normal pericardium.  No pericardial  effusion.  IMPRESSION: Moderate to severely dilated left ventricle, thin myocardium, and systolic function is moderately reduced (LVEF =36%). There is mid anterior, anteroseptal, and inferoseptal hypokinesis, as well as apical hypokinesis (all territories). No LV thrombus is noted. There is transmural late gadolinium enhancement: Inferoseptal mid and apex, anteroseptal mid and apex, anterior mid and apex, anterolateral apex, and true apex. These segments are likely nonviable.  Bryan Melendez   Electronically Signed By: Marlan Palau MD On: 12/10/2019 15:33   ______________________________________________________________________________________________       Physical Exam:    VS:  BP 110/60 (BP Location: Right Arm)   Pulse 63   Ht 5\' 6"  (1.676 m)   Wt 71.2 kg   SpO2 96%   BMI 25.34 kg/m     Wt Readings from Last 3 Encounters:  05/17/23 71.2 kg  12/14/21 70.8 kg  07/13/21 73 kg    Gen: no distress   Neck: No JVD Ears:  Bilateral Frank Sign Cardiac: No Rubs or Gallops, no Mmrmur, regular bradycardia, +2 radial pulses Respiratory: Clear to auscultation bilaterally, normal effort, normal  respiratory rate GI: Soft, nontender, non-distended  MS: no edema;  moves all extremities Integument: Skin feels warm Neuro:  At time of evaluation, alert and oriented to person/place/time/situation  Psych: Normal affect, patient feels   ASSESSMENT:    1. HFrEF (heart failure with reduced ejection fraction) (HCC)  2. Coronary artery disease of native artery of native heart with stable angina pectoris (HCC)   3. Aortic atherosclerosis (HCC)     PLAN:    Coronary Artery Disease; Moderate Disease HLD with DM Aortic atherosclerosis - asymptomatic  - anatomy: D1 disease, distal LAD disease, distal RCA disease - continue ASA 81 mg -  atorvastatin 40 , goal LDL < 70  - no nitroglycerin need; continue current therapy  Heart Failure with reduced ejection  fraction - NYHA class I, Stage B, euvolemic, likely coronary mediated - continue current therapy - he and family know that if they would like to re-consider ICD for primary prevention I am happy to either refer him to EP or discuss with him again  Heart murmur . He is asymptomatic, with no chest pain or dyspnea. The last echocardiogram was in 2022, and he has been feeling well since then. An echocardiogram is recommended to assess for valvular insufficiency or other issues, given the time elapsed since the last evaluation. - Order echocardiogram to assess for valvular insufficiency or other issues given reduced EF - Schedule follow-up in one year unless symptoms develop  Hypertension - continue current therapy  One year with me   Medication Adjustments/Labs and Tests Ordered: Current medicines are reviewed at length with the patient today.  Concerns regarding medicines are outlined above.  Orders Placed This Encounter  Procedures   EKG 12-Lead   ECHOCARDIOGRAM COMPLETE    No orders of the defined types were placed in this encounter.    Patient Instructions  Medication Instructions:  Your physician recommends that you continue on your current medications as directed. Please refer to the Current Medication list given to you today.  *If you need a refill on your cardiac medications before your next appointment, please call your pharmacy*  Testing/Procedures: Your physician has requested that you have an echocardiogram. Echocardiography is a painless test that uses sound waves to create images of your heart. It provides your doctor with information about the size and shape of your heart and how well your heart's chambers and valves are working. This procedure takes approximately one hour. There are no restrictions for this procedure. Please do NOT wear cologne, perfume, aftershave, or lotions (deodorant is allowed). Please arrive 15 minutes prior to your appointment time.  Please  note: We ask at that you not bring children with you during ultrasound (echo/ vascular) testing. Due to room size and safety concerns, children are not allowed in the ultrasound rooms during exams. Our front office staff cannot provide observation of children in our lobby area while testing is being conducted. An adult accompanying a patient to their appointment will only be allowed in the ultrasound room at the discretion of the ultrasound technician under special circumstances. We apologize for any inconvenience.  Follow-Up: At [facility], you and your health needs are our priority.  As part of our continuing mission to provide you with exceptional heart care, we have created designated Provider Care Teams.  These Care Teams include your primary Cardiologist (physician) and Advanced Practice Providers (APPs -  Physician Assistants and Nurse Practitioners) who all work together to provide you with the care you need, when you need it.  Your next appointment:   1 year(s)  The format for your next appointment:   In Person  Provider:   Christell Constant, MD {  Other Instructions   1st Floor: - Lobby - Registration  - Pharmacy  - Lab - Cafe  2nd Floor: -  PV Lab - Diagnostic Testing (echo, CT, nuclear med)  3rd Floor: - Vacant  4th Floor: - TCTS (cardiothoracic surgery) - AFib Clinic - Structural Heart Clinic - Vascular Surgery  - Vascular Ultrasound  5th Floor: - HeartCare Cardiology (general and EP) - Clinical Pharmacy for coumadin, hypertension, lipid, weight-loss medications, and med management appointments    Valet parking services will be available as well.     Riley Lam, MD FASE [facility] Cardiologist [facility]  [address] [place], [address], [place] [address] [phone]  8:59 AM

## 2023-06-08 ENCOUNTER — Other Ambulatory Visit: Payer: Self-pay | Admitting: Internal Medicine

## 2023-06-13 ENCOUNTER — Other Ambulatory Visit: Payer: Self-pay | Admitting: Internal Medicine

## 2023-06-27 ENCOUNTER — Ambulatory Visit (HOSPITAL_COMMUNITY)
Admission: RE | Admit: 2023-06-27 | Discharge: 2023-06-27 | Disposition: A | Discharge status: Home / Self Care | Payer: Medicare (Managed Care) | Source: Ambulatory Visit | Attending: Cardiovascular Disease | Admitting: Cardiovascular Disease

## 2023-06-27 DIAGNOSIS — I5020 Unspecified systolic (congestive) heart failure: Secondary | ICD-10-CM

## 2023-06-27 LAB — ECHOCARDIOGRAM COMPLETE
Area-P 1/2: 2.03 cm2
P 1/2 time: 1256 ms
S' Lateral: 4.4 cm

## 2023-06-27 MED ORDER — PERFLUTREN LIPID MICROSPHERE
1.0000 mL | INTRAVENOUS | Status: AC | PRN
  Administered 2023-06-27: 2 mL via INTRAVENOUS

## 2023-06-28 ENCOUNTER — Ambulatory Visit: Payer: Self-pay | Admitting: *Deleted

## 2023-09-07 ENCOUNTER — Other Ambulatory Visit: Payer: Self-pay

## 2023-09-07 MED ORDER — EMPAGLIFLOZIN 10 MG PO TABS: 10.0000 mg | ORAL_TABLET | Freq: Every day | ORAL | 2 refills | Status: AC

## 2023-09-10 ENCOUNTER — Other Ambulatory Visit: Payer: Self-pay | Admitting: Internal Medicine
